# Patient Record
Sex: Female | Born: 1975 | Race: Asian | Hispanic: No | Smoking: Never smoker
Health system: Southern US, Community
[De-identification: ages and names within clinical notes are randomized; demographics above are authoritative.]

## PROBLEM LIST (undated history)

## (undated) DIAGNOSIS — J302 Other seasonal allergic rhinitis: Secondary | ICD-10-CM

## (undated) DIAGNOSIS — E2609 Other primary hyperaldosteronism: Secondary | ICD-10-CM

## (undated) DIAGNOSIS — R7303 Prediabetes: Secondary | ICD-10-CM

## (undated) DIAGNOSIS — D649 Anemia, unspecified: Secondary | ICD-10-CM

## (undated) DIAGNOSIS — N939 Abnormal uterine and vaginal bleeding, unspecified: Secondary | ICD-10-CM

## (undated) DIAGNOSIS — Z973 Presence of spectacles and contact lenses: Secondary | ICD-10-CM

## (undated) DIAGNOSIS — K649 Unspecified hemorrhoids: Secondary | ICD-10-CM

## (undated) DIAGNOSIS — I10 Essential (primary) hypertension: Secondary | ICD-10-CM

## (undated) HISTORY — PX: ADRENALECTOMY: SHX876

## (undated) HISTORY — PX: DILATION AND CURETTAGE OF UTERUS: SHX78

## (undated) HISTORY — PX: TUBAL LIGATION: SHX77

## (undated) HISTORY — PX: TRANSTHORACIC ECHOCARDIOGRAM: SHX275

## (undated) HISTORY — DX: Unspecified hemorrhoids: K64.9

---

## 2007-09-07 ENCOUNTER — Ambulatory Visit: Payer: Self-pay | Admitting: Orthopedic Surgery

## 2008-03-09 ENCOUNTER — Ambulatory Visit: Payer: Self-pay | Admitting: Obstetrics & Gynecology

## 2008-10-05 ENCOUNTER — Ambulatory Visit: Payer: Self-pay | Admitting: Obstetrics & Gynecology

## 2008-10-05 LAB — CONVERTED CEMR LAB
GC Probe Amp, Genital: NEGATIVE
Trich, Wet Prep: NONE SEEN

## 2008-11-07 ENCOUNTER — Encounter: Payer: Self-pay | Admitting: Family Medicine

## 2008-11-07 ENCOUNTER — Ambulatory Visit: Payer: Self-pay | Admitting: Family Medicine

## 2009-01-05 ENCOUNTER — Ambulatory Visit: Payer: Self-pay | Admitting: Family Medicine

## 2010-06-11 NOTE — Assessment & Plan Note (Signed)
Judith Wheeler, PEPPERMAN                   ACCOUNT NO.:  [id number]   MEDICAL RECORD NO.:  [id number]          PATIENT TYPE:  POB   LOCATION:  [facility] at [facility]         FACILITY:  [facility]   PHYSICIAN:  Tinnie Gens, MD        DATE OF BIRTH:  1975/03/19   DATE OF SERVICE:  11/07/2008                                  CLINIC NOTE   CHIEF COMPLAINT:  Followup Pap.   HISTORY OF PRESENT ILLNESS:  The patient is a 35 year old gravida 3,  para 2-1-2 who had a history of abnormal Pap, had at [facility]  apparently.  Her colposcopy was not adequate.  She had a followup  colposcopy with Dr. Nicholaus Bloom in February 2010 with a negative biopsy  and negative Pap, with a negative ECC and negative Pap earlier in  January.  The patient was supposed to have a followup Pap in 6 months  and the patient is back for this today.  The patient has no other  significant complaints.  She had a full physical by Dr. Nicholaus Bloom in  February.   PHYSICAL EXAMINATION:  VITAL SIGNS:  Her vitals are as noted in the  chart.  GENERAL:  She is a well-developed and well-nourished female in no acute  distress.  ABDOMEN:  Soft, nontender, nondistended.  GU:  Normal external female genitalia.  BUS is normal.  Vagina is pink  and rugated.  Cervix is nulliparous without lesion.  Uterus is small,  anteverted.  No adnexal mass or tenderness.   IMPRESSION:  History of abnormal Pap ASCUS with a negative ECC in  February 2010, for followup Pap today.   PLAN:  Check results in approximately 2 weeks.           ______________________________  Tinnie Gens, MD     TP/MEDQ  D:  11/07/2008  T:  11/08/2008  Job:  [id number]

## 2010-06-13 ENCOUNTER — Ambulatory Visit (INDEPENDENT_AMBULATORY_CARE_PROVIDER_SITE_OTHER): Payer: BC Managed Care – PPO | Admitting: Obstetrics and Gynecology

## 2010-06-13 DIAGNOSIS — Z1272 Encounter for screening for malignant neoplasm of vagina: Secondary | ICD-10-CM

## 2010-06-13 DIAGNOSIS — Z01419 Encounter for gynecological examination (general) (routine) without abnormal findings: Secondary | ICD-10-CM

## 2010-06-13 NOTE — Assessment & Plan Note (Signed)
Judith Wheeler, Judith Wheeler NO.:  [id number]  MEDICAL RECORD NO.:  [id number]           PATIENT TYPE:  LOCATION:  [facility] at [facility]           FACILITY:  PHYSICIAN:  Catalina Antigua, MD     DATE OF BIRTH:  July 22, 1975  DATE OF SERVICE:  06/13/2010                                 CLINIC NOTE  This is a 35 year old G3, P2-0-1-2 with LMP on May 20, 2010, who presents today for annual exam.  The patient is currently without any complaints.  Denies abnormal bleeding or discharge or pelvic pain.  The patient is currently using tubal ligation for birth control.  PAST MEDICAL HISTORY:  Significant for hypertension.  PAST SURGICAL HISTORY:  She has had a tubal ligation in 2004.  PAST OB HISTORY:  She has had two full-term C-section and one miscarriage.  PAST GYN HISTORY:  She denies any cyst or fibroids.  She does have a history of abnormal Pap smear in 2008, which was followed by colposcopy and repeat Pap smear.  FAMILY HISTORY:  Significant for hypertension in her parents.  SOCIAL HISTORY:  She denies drinking, smoking, or use of illicit drugs.  REVIEW OF SYSTEMS:  Otherwise within normal limits.  PHYSICAL EXAMINATION:  VITAL SIGNS:  Her blood pressure is 154/94, pulse of 66, weight of 163 pounds, height of 5 feet and 4 inches. LUNGS:  Clear to auscultation bilaterally. HEART:  Regular rate and rhythm. ABDOMEN:  Soft, nontender, nondistended. BREAST:  Equal in size, nontender, no palpable masses, or lymphadenopathy.  No expressible nipple discharge.  No skin dimpling. PELVIC:  Showed normal-appearing external genitalia, normal-appearing vaginal mucosa and cervix.  No abnormal bleeding or discharge.  She has small anteverted uterus.  No palpable adnexal masses or tenderness.  ASSESSMENT AND PLAN:  This is a 35 year old G3, P2-0-1-2 who is here for annual exam.  Pap smear was performed.  The patient is not interested in any STD testing.  The patient will be  contacted with any abnormal results.  The patient is to return in a year or p.r.n.  The patient was also advised to follow up with her primary care physician for the management of her hypertension.          ______________________________ Catalina Antigua, MD    PC/MEDQ  D:  06/13/2010  T:  06/13/2010  Job:  [id number]

## 2011-01-28 HISTORY — PX: OTHER SURGICAL HISTORY: SHX169

## 2011-02-27 ENCOUNTER — Emergency Department: Payer: Self-pay | Admitting: Emergency Medicine

## 2011-02-27 LAB — CBC WITH DIFFERENTIAL/PLATELET
Eosinophil #: 0.4 10*3/uL (ref 0.0–0.7)
Lymphocyte %: 34.8 %
MCH: 28.6 pg (ref 26.0–34.0)
Monocyte %: 7.3 %
Platelet: 325 10*3/uL (ref 150–440)
RDW: 13.8 % (ref 11.5–14.5)
WBC: 9.3 10*3/uL (ref 3.6–11.0)

## 2011-02-27 LAB — COMPREHENSIVE METABOLIC PANEL
Albumin: 4.6 g/dL (ref 3.4–5.0)
Anion Gap: 12 (ref 7–16)
BUN: 6 mg/dL — ABNORMAL LOW (ref 7–18)
Calcium, Total: 9.1 mg/dL (ref 8.5–10.1)
Chloride: 97 mmol/L — ABNORMAL LOW (ref 98–107)
Creatinine: 0.43 mg/dL — ABNORMAL LOW (ref 0.60–1.30)
EGFR (African American): >60
Osmolality: 284 (ref 275–301)
Potassium: 2.2 mmol/L — CL (ref 3.5–5.1)
SGOT(AST): 36 U/L (ref 15–37)
SGPT (ALT): 52 U/L
Sodium: 143 mmol/L (ref 136–145)

## 2011-02-28 LAB — POTASSIUM: Potassium: 2.9 mmol/L — ABNORMAL LOW (ref 3.5–5.1)

## 2011-02-28 LAB — MAGNESIUM: Magnesium: 2.2 mg/dL

## 2011-10-03 ENCOUNTER — Emergency Department: Payer: Self-pay | Admitting: Emergency Medicine

## 2011-10-03 LAB — BASIC METABOLIC PANEL
Calcium, Total: 8.9 mg/dL (ref 8.5–10.1)
Co2: 30 mmol/L (ref 21–32)
EGFR (African American): >60
Glucose: 112 mg/dL — ABNORMAL HIGH (ref 65–99)
Osmolality: 282 (ref 275–301)
Potassium: 2.7 mmol/L — ABNORMAL LOW (ref 3.5–5.1)
Sodium: 141 mmol/L (ref 136–145)

## 2011-10-03 LAB — CBC
MCV: 83 fL (ref 80–100)
Platelet: 312 10*3/uL (ref 150–440)
RBC: 4.44 10*6/uL (ref 3.80–5.20)
RDW: 14 % (ref 11.5–14.5)
WBC: 11 10*3/uL (ref 3.6–11.0)

## 2011-10-03 LAB — MAGNESIUM: Magnesium: 1.9 mg/dL

## 2011-11-24 ENCOUNTER — Ambulatory Visit: Payer: Self-pay | Admitting: Internal Medicine

## 2011-11-24 LAB — CREATININE, SERUM: EGFR (Non-African Amer.): >60

## 2012-01-12 ENCOUNTER — Telehealth: Payer: Self-pay | Admitting: *Deleted

## 2012-01-12 DIAGNOSIS — N390 Urinary tract infection, site not specified: Secondary | ICD-10-CM

## 2012-01-12 MED ORDER — CIPROFLOXACIN HCL 500 MG PO TABS: 500.0000 mg | ORAL_TABLET | Freq: Two times a day (BID) | ORAL | Status: DC

## 2012-01-12 NOTE — Telephone Encounter (Signed)
Patient is having pain and burning with urination.  She is unable to come to the office today due to working out of town.  We will call in meds for her and she will follow up if her symptoms return or persist for culture.

## 2012-08-30 ENCOUNTER — Telehealth: Payer: Self-pay | Admitting: *Deleted

## 2012-08-30 DIAGNOSIS — N390 Urinary tract infection, site not specified: Secondary | ICD-10-CM

## 2012-08-30 MED ORDER — CIPROFLOXACIN HCL 500 MG PO TABS: 500.0000 mg | ORAL_TABLET | Freq: Two times a day (BID) | ORAL | Status: DC

## 2012-08-30 NOTE — Telephone Encounter (Signed)
Patient is having symptoms of uti.  She gets this at least twice a year having urgency and discomfort with urination.. We will call in Cipro for her and she will come to office for culture if her symptoms persist or change.

## 2014-04-22 DIAGNOSIS — I10 Essential (primary) hypertension: Secondary | ICD-10-CM | POA: Insufficient documentation

## 2016-06-04 ENCOUNTER — Other Ambulatory Visit: Payer: Self-pay | Admitting: Family Medicine

## 2016-06-04 DIAGNOSIS — Z1231 Encounter for screening mammogram for malignant neoplasm of breast: Secondary | ICD-10-CM

## 2016-06-30 ENCOUNTER — Ambulatory Visit
Admission: RE | Admit: 2016-06-30 | Discharge: 2016-06-30 | Disposition: A | Discharge status: Home / Self Care | Payer: No Typology Code available for payment source | Source: Ambulatory Visit | Attending: Family Medicine | Admitting: Family Medicine

## 2016-06-30 ENCOUNTER — Encounter: Payer: Self-pay | Admitting: Radiology

## 2016-06-30 DIAGNOSIS — Z1231 Encounter for screening mammogram for malignant neoplasm of breast: Secondary | ICD-10-CM | POA: Diagnosis not present

## 2017-02-09 ENCOUNTER — Ambulatory Visit (INDEPENDENT_AMBULATORY_CARE_PROVIDER_SITE_OTHER): Payer: No Typology Code available for payment source | Admitting: Obstetrics & Gynecology

## 2017-02-09 VITALS — BP 163/115 | HR 72 | Wt 177.5 lb

## 2017-02-09 DIAGNOSIS — Z23 Encounter for immunization: Secondary | ICD-10-CM

## 2017-02-09 DIAGNOSIS — Z124 Encounter for screening for malignant neoplasm of cervix: Secondary | ICD-10-CM

## 2017-02-09 DIAGNOSIS — Z1151 Encounter for screening for human papillomavirus (HPV): Secondary | ICD-10-CM

## 2017-02-09 DIAGNOSIS — Z01419 Encounter for gynecological examination (general) (routine) without abnormal findings: Secondary | ICD-10-CM | POA: Diagnosis not present

## 2017-02-09 DIAGNOSIS — Z113 Encounter for screening for infections with a predominantly sexual mode of transmission: Secondary | ICD-10-CM | POA: Diagnosis not present

## 2017-02-09 DIAGNOSIS — K625 Hemorrhage of anus and rectum: Secondary | ICD-10-CM

## 2017-02-09 DIAGNOSIS — N920 Excessive and frequent menstruation with regular cycle: Secondary | ICD-10-CM

## 2017-02-09 MED ORDER — MEGESTROL ACETATE 40 MG PO TABS: 40.0000 mg | ORAL_TABLET | Freq: Two times a day (BID) | ORAL | 5 refills | Status: DC

## 2017-02-09 MED ORDER — MEGESTROL ACETATE 40 MG PO TABS: 40.0000 mg | ORAL_TABLET | Freq: Every day | ORAL | 5 refills | Status: DC

## 2017-02-09 NOTE — Progress Notes (Signed)
Last pap 2016

## 2017-02-09 NOTE — Progress Notes (Signed)
Subjective:    Judith Wheeler is a 42 y.o. divorced P2 (45 and 82 yo kids) female who presents for an annual exam.  She reports that her periods are getting heavier, especially on the first 2 days. She also has been having rectal bleeding for the last year. Bleeds through pads + tampons.  The patient is not currently sexually active. GYN screening history: last pap: was normal. The patient wears seatbelts: yes. The patient participates in regular exercise: no. Has the patient ever been transfused or tattooed?: no. The patient reports that there is not domestic violence in her life.   Menstrual History: OB History    No data available      Menarche age: 84 Patient's last menstrual period was 12/27/2016.    The following portions of the patient's history were reviewed and updated as appropriate: allergies, current medications, past family history, past medical history, past social history, past surgical history and problem list.  Review of Systems Pertinent items are noted in HPI.   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 Mammogram 6/18, normal   Objective:    BP (!) 163/115   Pulse 72   Wt 177 lb 8 oz (80.5 kg)   LMP 12/27/2016   General Appearance:    Alert, cooperative, no distress, appears stated age  Head:    Normocephalic, without obvious abnormality, atraumatic  Eyes:    PERRL, conjunctiva/corneas clear, EOM's intact, fundi    benign, both eyes  Ears:    Normal TM's and external ear canals, both ears  Nose:   Nares normal, septum midline, mucosa normal, no drainage    or sinus tenderness  Throat:   Lips, mucosa, and tongue normal; teeth and gums normal  Neck:   Supple, symmetrical, trachea midline, no adenopathy;    thyroid:  no enlargement/tenderness/nodules; no carotid   bruit or JVD  Back:     Symmetric, no curvature, ROM normal, no CVA tenderness  Lungs:     Clear to auscultation bilaterally, respirations unlabored  Chest Wall:    No  tenderness or deformity   Heart:    Regular rate and rhythm, S1 and S2 normal, no murmur, rub   or gallop  Breast Exam:    No tenderness, masses, or nipple abnormality  Abdomen:     Soft, non-tender, bowel sounds active all four quadrants,    no masses, no organomegaly  Genitalia:    Normal female without lesion, discharge or tenderness     Extremities:   Extremities normal, atraumatic, no cyanosis or edema  Pulses:   2+ and symmetric all extremities  Skin:   Skin color, texture, turgor normal, no rashes or lesions  Lymph nodes:   Cervical, supraclavicular, and axillary nodes normal  Neurologic:   CNII-XII intact, normal strength, sensation and reflexes    throughout  .    Assessment:    Healthy female exam.   Menorrhagia Rectal bleeding   Plan:     Thin prep Pap smear. with cotesting, STI testing Flu vaccine Refer to GI Gyn u/s, cbc, tsh She will be traveling in the [place] and will get her period while there. I have prescribed megace to be taken prn.

## 2017-02-10 LAB — HIV ANTIBODY (ROUTINE TESTING W REFLEX): HIV Screen 4th Generation wRfx: NONREACTIVE

## 2017-02-10 LAB — CBC
Hematocrit: 36.2 % (ref 34.0–46.6)
Hemoglobin: 11.7 g/dL (ref 11.1–15.9)
MCH: 27.6 pg (ref 26.6–33.0)
MCHC: 32.3 g/dL (ref 31.5–35.7)
MCV: 85 fL (ref 79–97)
Platelets: 271 x10E3/uL (ref 150–379)
RBC: 4.24 x10E6/uL (ref 3.77–5.28)
RDW: 13.7 % (ref 12.3–15.4)
WBC: 6.5 x10E3/uL (ref 3.4–10.8)

## 2017-02-10 LAB — TSH: TSH: 2.5 u[IU]/mL (ref 0.450–4.500)

## 2017-02-11 ENCOUNTER — Telehealth: Payer: Self-pay

## 2017-02-11 LAB — CYTOLOGY - PAP
ADEQUACY: ABSENT
CHLAMYDIA, DNA PROBE: NEGATIVE
DIAGNOSIS: NEGATIVE
HPV: NOT DETECTED
NEISSERIA GONORRHEA: NEGATIVE

## 2017-02-11 NOTE — Telephone Encounter (Signed)
-----   Message from Blanchie Dessert, [place] sent at 02/11/2017  9:18 AM EST ----- Regarding: question about medication Contact: [phone] Please call patient , she has a question about medication '

## 2017-02-11 NOTE — Telephone Encounter (Signed)
Called patient -left a message for her to call us back regarding medications.

## 2017-02-18 ENCOUNTER — Ambulatory Visit
Admission: RE | Admit: 2017-02-18 | Discharge: 2017-02-18 | Disposition: A | Discharge status: Home / Self Care | Payer: No Typology Code available for payment source | Source: Ambulatory Visit | Attending: Obstetrics & Gynecology | Admitting: Obstetrics & Gynecology

## 2017-02-18 DIAGNOSIS — D259 Leiomyoma of uterus, unspecified: Secondary | ICD-10-CM | POA: Insufficient documentation

## 2017-02-18 DIAGNOSIS — N920 Excessive and frequent menstruation with regular cycle: Secondary | ICD-10-CM | POA: Insufficient documentation

## 2017-02-19 ENCOUNTER — Other Ambulatory Visit: Payer: Self-pay

## 2017-02-19 ENCOUNTER — Ambulatory Visit (INDEPENDENT_AMBULATORY_CARE_PROVIDER_SITE_OTHER): Payer: No Typology Code available for payment source | Admitting: Gastroenterology

## 2017-02-19 ENCOUNTER — Encounter: Payer: Self-pay | Admitting: Gastroenterology

## 2017-02-19 VITALS — BP 131/90 | HR 80 | Ht 64.0 in | Wt 176.0 lb

## 2017-02-19 DIAGNOSIS — E2609 Other primary hyperaldosteronism: Secondary | ICD-10-CM | POA: Insufficient documentation

## 2017-02-19 DIAGNOSIS — K648 Other hemorrhoids: Secondary | ICD-10-CM | POA: Diagnosis not present

## 2017-02-19 DIAGNOSIS — K625 Hemorrhage of anus and rectum: Secondary | ICD-10-CM

## 2017-02-19 DIAGNOSIS — L811 Chloasma: Secondary | ICD-10-CM | POA: Insufficient documentation

## 2017-02-19 DIAGNOSIS — L719 Rosacea, unspecified: Secondary | ICD-10-CM | POA: Insufficient documentation

## 2017-02-19 DIAGNOSIS — M722 Plantar fascial fibromatosis: Secondary | ICD-10-CM | POA: Insufficient documentation

## 2017-02-19 DIAGNOSIS — J302 Other seasonal allergic rhinitis: Secondary | ICD-10-CM | POA: Insufficient documentation

## 2017-02-19 MED ORDER — HYDROCORTISONE ACETATE 25 MG RE SUPP: 25.0000 mg | Freq: Every day | RECTAL | 0 refills | Status: DC

## 2017-02-19 NOTE — Patient Instructions (Signed)
F/U in 3 months Contact office if symptoms do not improve.

## 2017-02-19 NOTE — Progress Notes (Signed)
Judith Wheeler [address]  [place]  [place], [place] [address]  Main: [phone]  Fax: [phone]   Gastroenterology Consultation  Referring Provider:     Emily Filbert, MD Primary Care Physician:  Sharyne Peach, MD Primary Gastroenterologist:  Dr. Vonda Wheeler Reason for Consultation:    Rectal bleeding        HPI:   Judith Wheeler is a 42 y.o. y/o female referred for consultation & management  by Dr. Iona Beard, Rubbie Battiest, MD.  Patient reports history of bright red blood per rectum, started 3-4 years ago, intermittent, has occurred 3-4 times over the last year.  Patient reports bright red blood per rectum has only occurred toward the end of her menstrual cycle each time.  It has not occurred without her menstrual cycle in the past.  She denies any significant pain at the start of her menstrual cycle, however, when the rectal bleeding starts, she reports a dull mid abdominal, 3/10 pain that lasts for 1-2 days with the rectal bleeding.  She is sure that the bleeding is different from her menstrual bleeding, as it is bright red.  She states she sometimes feels hemorrhoids when she wipes.  The right red blood is on the toilet paper and toilet bowl itself.  She reports 1-2 regular bowel movements daily, states they are not loose or hard.  Describes them as regular.  Denies straining with bowel movements.  Denies any urgency or tenesmus.  She states she recently had a vaginal exam vaginal exam she had a lot of lower abdominal pain.  She is not sexually active.  Patient denies any  nausea, weight loss, appetite loss, dysphagia, heartburn.  Denies any altered bowel habits.  No family history of colon cancer.  No previous history of endoscopies.  Past medical history: Hypertension, acne Past surgical history: Adrenal mass resection 2013, C-section  Prior to Admission medications   Medication Sig Start Date End Date Taking? Authorizing Provider  ciprofloxacin (CIPRO) 500 MG tablet  Take 1 tablet (500 mg total) by mouth 2 (two) times daily. Patient not taking: Reported on 02/09/2017 08/30/12   Emily Filbert, MD  megestrol (MEGACE) 40 MG tablet Take 1 tablet (40 mg total) by mouth 2 (two) times daily. 02/09/17   Emily Filbert, MD  megestrol (MEGACE) 40 MG tablet Take 1 tablet (40 mg total) by mouth daily. 02/09/17   Emily Filbert, MD    Family History  Problem Relation Age of Onset  . Breast cancer Neg Hx      Social History   Tobacco Use  . Smoking status: Not on file  Substance Use Topics  . Alcohol use: Not on file  . Drug use: Not on file    Allergies as of 02/19/2017  . (No Known Allergies)    Review of Systems:    All systems reviewed and negative except where noted in HPI.   Physical Exam:  Vitals reviewed  Vitals:   02/19/17 0934  BP: 131/90  Pulse: 80  Weight: 176 lb (79.8 kg)  Height: 5\' 4"  (1.626 m)    psych:  Alert and cooperative. Normal mood and affect. General:   Alert,  Well-developed, well-nourished, pleasant and cooperative in NAD Head:  Normocephalic and atraumatic. Eyes:  Sclera clear, no icterus.   Conjunctiva pink. Ears:  Normal auditory acuity. Nose:  No deformity, discharge, or lesions. Mouth:  No deformity or lesions,oropharynx pink & moist. Neck:  Supple; no masses or thyromegaly. Lungs:  Respirations even and unlabored.  Clear throughout to auscultation.   No wheezes, crackles, or rhonchi. No acute distress. Heart:  Regular rate and rhythm; no murmurs, clicks, rubs, or gallops. Abdomen:  Normal bowel sounds.  No bruits.  Soft, non-tender and non-distended without masses, hepatosplenomegaly or hernias noted.  No guarding or rebound tenderness.    Rectal: One small external hemorrhoid present.  Digital rectal exam showed brown stool, with no melena or bright blood.  Stool present in rectal vault, no masses present on digital rectal exam Msk:  Symmetrical without gross deformities. Good, equal movement & strength  bilaterally. Pulses:  Normal pulses noted. Extremities:  No clubbing or edema.  No cyanosis. Neurologic:  Alert and oriented x3;  grossly normal neurologically. Skin:  Intact without significant lesions or rashes. No jaundice. Lymph Nodes:  No significant cervical adenopathy. Psych:  Alert and cooperative. Normal mood and affect.   Labs: CBC    Component Value Date/Time   WBC 6.5 02/09/2017 1532   WBC 11.0 10/03/2011 1911   RBC 4.24 02/09/2017 1532   RBC 4.44 10/03/2011 1911   HGB 11.7 02/09/2017 1532   HCT 36.2 02/09/2017 1532   PLT 271 02/09/2017 1532   MCV 85 02/09/2017 1532   MCV 83 10/03/2011 1911   MCH 27.6 02/09/2017 1532   MCH 27.9 10/03/2011 1911   MCHC 32.3 02/09/2017 1532   MCHC 33.8 10/03/2011 1911   RDW 13.7 02/09/2017 1532   RDW 14.0 10/03/2011 1911   LYMPHSABS 3.2 02/27/2011 2022   MONOABS 0.7 02/27/2011 2022   EOSABS 0.4 02/27/2011 2022   BASOSABS 0.0 02/27/2011 2022   CMP     Component Value Date/Time   NA 141 10/03/2011 1911   K 2.7 (L) 10/03/2011 1911   CL 104 10/03/2011 1911   CO2 30 10/03/2011 1911   GLUCOSE 112 (H) 10/03/2011 1911   BUN 12 10/03/2011 1911   CREATININE 0.53 (L) 11/24/2011 0958   CALCIUM 8.9 10/03/2011 1911   PROT 8.5 (H) 02/27/2011 2022   ALBUMIN 4.6 02/27/2011 2022   AST 36 02/27/2011 2022   ALT 52 02/27/2011 2022   ALKPHOS 51 02/27/2011 2022   BILITOT 0.4 02/27/2011 2022   GFRNONAA >60 11/24/2011 0958   GFRAA >60 11/24/2011 0958    Imaging Studies: US Pelvis Transvanginal Non-ob (tv Only)  Result Date: 02/18/2017 CLINICAL DATA:  Menorrhagia with regular cycle, LMP 01/24/2017 EXAM: ULTRASOUND PELVIS TRANSVAGINAL TECHNIQUE: Transvaginal ultrasound examination of the pelvis was performed including evaluation of the uterus, ovaries, adnexal regions, and pelvic cul-de-sac. COMPARISON:  None FINDINGS: Uterus Measurements: 8.1 x 5.4 x 5.8 cm. Multiple nabothian cysts at cervix. Two uterine nodules likely representing  leiomyomata. 2.0 x 2.0 x 1.5 cm diameter submucosal lesion at the posterior upper to mid uterus. Small intramural leiomyoma LEFT lateral at upper uterus 1.6 x 1.4 x 1.3 cm. No additional masses Endometrium Thickness: 14 mm. Impinged upon by a posterior submucosal leiomyoma at the upper to mid uterus. No endometrial fluid. Right ovary Measurements: 3.5 x 1.7 x 2.3 cm. Normal morphology without mass. Internal blood flow present on color Doppler imaging. Left ovary Measurements: 2.4 x 1.5 x 1.6 cm. Normal morphology without mass. Internal blood flow present on color Doppler imaging. Other findings: Small amount of nonspecific free pelvic fluid. No adnexal masses. IMPRESSION: 2 uterine leiomyomata, largest 2.0 cm in diameter submucosal at upper to mid uterus posteriorly. Small amount of nonspecific free pelvic fluid. Otherwise negative exam. Electronically Signed   By: Elta Guadeloupe  Thornton Papas M.D.   On: 02/18/2017 17:00    Assessment and Plan:   Lovely Kerins is a 42 y.o. y/o female has been referred for bright red blood per rectum, that started 3-4 years ago, with rectal bleeding only occurring at the end of her menstrual cycle each time  Patient symptoms could be related to hemorrhoids, she has external hemorrhoids on her exam today We will start at bedtime and Anusol suppositories for 14 days. High-fiber diet Patient has no anemia on lab exam, despite the symptoms occurring intermittently for 3 or 4 years now.  This would go against colonic malignancy or IBD.  Since her symptoms occur at the end of her menstrual cycle each time, and patient describes significant pelvic pain after her recent vaginal exam.  I would question endometriosis.  I will send this note to her GYN to obtain their opinion in regard to this.  Patient states she has a follow-up appointment with her GYN in a few weeks and will discuss this with them.    Rectal or colonic endometriosis implants could also be causing her rectal bleeding.  If symptoms  do not improve, can consider colonoscopy. Patient asked to call us if symptoms do not improve  Follow-up in clinic in 3 months.  Dr Margretta Sidle Tahilianiinion in regard to this.

## 2017-03-19 ENCOUNTER — Ambulatory Visit (INDEPENDENT_AMBULATORY_CARE_PROVIDER_SITE_OTHER): Payer: No Typology Code available for payment source | Admitting: Family Medicine

## 2017-03-19 ENCOUNTER — Encounter: Payer: Self-pay | Admitting: Family Medicine

## 2017-03-19 VITALS — BP 161/101 | HR 72 | Wt 181.8 lb

## 2017-03-19 DIAGNOSIS — Z30430 Encounter for insertion of intrauterine contraceptive device: Secondary | ICD-10-CM

## 2017-03-19 DIAGNOSIS — N920 Excessive and frequent menstruation with regular cycle: Secondary | ICD-10-CM | POA: Diagnosis not present

## 2017-03-19 MED ORDER — LEVONORGESTREL 19.5 MCG/DAY IU IUD
INTRAUTERINE_SYSTEM | Freq: Once | INTRAUTERINE | Status: AC
Start: 2017-03-19 — End: 2017-03-19
  Administered 2017-03-19: 12:00:00 via INTRAUTERINE

## 2017-03-19 NOTE — Assessment & Plan Note (Signed)
Patient offered medicine, IUD, HTA (she is s/p BTL) or hyst--3 prior c-sections.  She wanted IUD first due to less risk factors--placed today. Try Ibuprofen. If not better in 24 hours, please call us back.

## 2017-03-19 NOTE — Patient Instructions (Signed)
Uterine Fibroids Uterine fibroids are tissue masses (tumors). They are also called leiomyomas. They can develop inside of a woman's womb (uterus). They can grow very large. Fibroids are not cancerous (benign). Most fibroids do not require medical treatment. Follow these instructions at home:  Keep all follow-up visits as told by your doctor. This is important.  Take medicines only as told by your doctor. ? If you were prescribed a hormone treatment, take the hormone medicines exactly as told. ? Do not take aspirin. It can cause bleeding.  Ask your doctor about taking iron pills and increasing the amount of dark green, leafy vegetables in your diet. These actions can help to boost your blood iron levels.  Pay close attention to your period. Tell your doctor about any changes, such as: ? Increased blood flow. This may require you to use more pads or tampons than usual per month. ? A change in the number of days that your period lasts per month. ? A change in symptoms that come with your period, such as back pain or cramping in your belly area (abdomen). Contact a doctor if:  You have pain in your back or the area between your hip bones (pelvic area) that is not controlled by medicines.  You have pain in your abdomen that is not controlled with medicines.  You have an increase in bleeding between and during periods.  You soak tampons or pads in a half hour or less.  You feel lightheaded.  You feel extra tired.  You feel weak. Get help right away if:  You pass out (faint).  You have a sudden increase in pelvic pain. This information is not intended to replace advice given to you by your health care provider. Make sure you discuss any questions you have with your health care provider. Document Released: 02/15/2010 Document Revised: 09/14/2015 Document Reviewed: 07/12/2013 Elsevier Interactive Patient Education  2018 [employer]. Levonorgestrel intrauterine device (IUD) What is  this medicine? LEVONORGESTREL IUD (LEE voe nor jes trel) is a contraceptive (birth control) device. The device is placed inside the uterus by a healthcare professional. It is used to prevent pregnancy. This device can also be used to treat heavy bleeding that occurs during your period. This medicine may be used for other purposes; ask your health care provider or pharmacist if you have questions. COMMON BRAND NAME(S): Minette Headland What should I tell my health care provider before I take this medicine? They need to know if you have any of these conditions: -abnormal Pap smear -cancer of the breast, uterus, or cervix -diabetes -endometritis -genital or pelvic infection now or in the past -have more than one sexual partner or your partner has more than one partner -heart disease -history of an ectopic or tubal pregnancy -immune system problems -IUD in place -liver disease or tumor -problems with blood clots or take blood-thinners -seizures -use intravenous drugs -uterus of unusual shape -vaginal bleeding that has not been explained -an unusual or allergic reaction to levonorgestrel, other hormones, silicone, or polyethylene, medicines, foods, dyes, or preservatives -pregnant or trying to get pregnant -breast-feeding How should I use this medicine? This device is placed inside the uterus by a health care professional. Talk to your pediatrician regarding the use of this medicine in children. Special care may be needed. Overdosage: If you think you have taken too much of this medicine contact a poison control center or emergency room at once. NOTE: This medicine is only for you. Do not share this  medicine with others. What if I miss a dose? This does not apply. Depending on the brand of device you have inserted, the device will need to be replaced every 3 to 5 years if you wish to continue using this type of birth control. What may interact with this medicine? Do not  take this medicine with any of the following medications: -amprenavir -bosentan -fosamprenavir This medicine may also interact with the following medications: -aprepitant -armodafinil -barbiturate medicines for inducing sleep or treating seizures -bexarotene -boceprevir -griseofulvin -medicines to treat seizures like carbamazepine, ethotoin, felbamate, oxcarbazepine, phenytoin, topiramate -modafinil -pioglitazone -rifabutin -rifampin -rifapentine -some medicines to treat HIV infection like atazanavir, efavirenz, indinavir, lopinavir, nelfinavir, tipranavir, ritonavir -St. John's wort -warfarin This list may not describe all possible interactions. Give your health care provider a list of all the medicines, herbs, non-prescription drugs, or dietary supplements you use. Also tell them if you smoke, drink alcohol, or use illegal drugs. Some items may interact with your medicine. What should I watch for while using this medicine? Visit your doctor or health care professional for regular check ups. See your doctor if you or your partner has sexual contact with others, becomes HIV positive, or gets a sexual transmitted disease. This product does not protect you against HIV infection (AIDS) or other sexually transmitted diseases. You can check the placement of the IUD yourself by reaching up to the top of your vagina with clean fingers to feel the threads. Do not pull on the threads. It is a good habit to check placement after each menstrual period. Call your doctor right away if you feel more of the IUD than just the threads or if you cannot feel the threads at all. The IUD may come out by itself. You may become pregnant if the device comes out. If you notice that the IUD has come out use a backup birth control method like condoms and call your health care provider. Using tampons will not change the position of the IUD and are okay to use during your period. This IUD can be safely scanned with  magnetic resonance imaging (MRI) only under specific conditions. Before you have an MRI, tell your healthcare provider that you have an IUD in place, and which type of IUD you have in place. What side effects may I notice from receiving this medicine? Side effects that you should report to your doctor or health care professional as soon as possible: -allergic reactions like skin rash, itching or hives, swelling of the face, lips, or tongue -fever, flu-like symptoms -genital sores -high blood pressure -no menstrual period for 6 weeks during use -pain, swelling, warmth in the leg -pelvic pain or tenderness -severe or sudden headache -signs of pregnancy -stomach cramping -sudden shortness of breath -trouble with balance, talking, or walking -unusual vaginal bleeding, discharge -yellowing of the eyes or skin Side effects that usually do not require medical attention (report to your doctor or health care professional if they continue or are bothersome): -acne -breast pain -change in sex drive or performance -changes in weight -cramping, dizziness, or faintness while the device is being inserted -headache -irregular menstrual bleeding within first 3 to 6 months of use -nausea This list may not describe all possible side effects. Call your doctor for medical advice about side effects. You may report side effects to FDA at 1-800-FDA-1088. Where should I keep my medicine? This does not apply. NOTE: This sheet is a summary. It may not cover all possible information. If you have questions  about this medicine, talk to your doctor, pharmacist, or health care provider.  2018 Elsevier/Gold Standard (2015-10-26 14:14:56)

## 2017-03-19 NOTE — Progress Notes (Signed)
   Subjective:    Patient ID: Judith Wheeler is a 42 y.o. female presenting with Follow-up (AUB )  on 03/19/2017  HPI: Reports some bleeding for the last 3 wks. Was not taking her Megace. nml TSH and CBC. U/s shows 2 cm submucosal fibroid.  Review of Systems  Constitutional: Negative for chills and fever.  Respiratory: Negative for shortness of breath.   Cardiovascular: Negative for chest pain.  Gastrointestinal: Negative for abdominal pain, nausea and vomiting.  Genitourinary: Negative for dysuria.  Skin: Negative for rash.      Objective:    BP (!) 161/101   Pulse 72   Wt 181 lb 12.8 oz (82.5 kg)   BMI 31.21 kg/m  Physical Exam  Constitutional: She is oriented to person, place, and time. She appears well-developed and well-nourished. No distress.  HENT:  Head: Normocephalic and atraumatic.  Eyes: No scleral icterus.  Neck: Neck supple.  Cardiovascular: Normal rate.  Pulmonary/Chest: Effort normal.  Abdominal: Soft.  Neurological: She is alert and oriented to person, place, and time.  Skin: Skin is warm and dry.  Psychiatric: She has a normal mood and affect.   Procedure: Patient identified, informed consent performed, signed copy in chart, time out was performed.  Urine pregnancy test negative.  Speculum placed in the vagina.  Cervix visualized.  Cleaned with Betadine x 2.  Grasped anteriourly with a single tooth tenaculum.  Uterus sounded to 9 cm.  Liletta IUD placed per manufacturer's recommendations.  Strings trimmed to 3 cm.   Patient given post procedure instructions and Liletta care card with expiration date.  Patient is asked to check IUD strings periodically and follow up in 4-6 weeks for IUD check.  Following the procedure, patient complained of intense pain on the left side. Taken to U/s where TVUS revealed IUD to be in the appropriate location. Significant bleeding noted.      Assessment & Plan:   Problem List Items Addressed This Visit      Unprioritized   Menorrhagia    Patient offered medicine, IUD, HTA (she is s/p BTL) or hyst--3 prior c-sections.  She wanted IUD first due to less risk factors--placed today. Try Ibuprofen. If not better in 24 hours, please call us back.       Other Visit Diagnoses    Encounter for IUD insertion    -  Primary   Relevant Medications   Levonorgestrel (LILETTA) 19.5 MCG/DAY IUD (Completed)      Total face-to-face time with patient: 15 minutes. Over 50% of encounter was spent on counseling and coordination of care. Return in about 2 months (around 05/17/2017) for a follow-up.  Judith Wheeler 03/19/2017 10:26 AM

## 2017-03-23 ENCOUNTER — Encounter (HOSPITAL_COMMUNITY): Payer: Self-pay

## 2017-03-23 ENCOUNTER — Inpatient Hospital Stay (HOSPITAL_COMMUNITY)
Admission: AD | Admit: 2017-03-23 | Discharge: 2017-03-24 | Disposition: A | Discharge status: Home / Self Care | Payer: No Typology Code available for payment source | Source: Ambulatory Visit | Attending: Emergency Medicine | Admitting: Emergency Medicine

## 2017-03-23 ENCOUNTER — Telehealth: Payer: Self-pay | Admitting: Radiology

## 2017-03-23 DIAGNOSIS — R1030 Lower abdominal pain, unspecified: Secondary | ICD-10-CM | POA: Insufficient documentation

## 2017-03-23 DIAGNOSIS — Z79899 Other long term (current) drug therapy: Secondary | ICD-10-CM | POA: Insufficient documentation

## 2017-03-23 DIAGNOSIS — N920 Excessive and frequent menstruation with regular cycle: Secondary | ICD-10-CM | POA: Insufficient documentation

## 2017-03-23 DIAGNOSIS — M722 Plantar fascial fibromatosis: Secondary | ICD-10-CM | POA: Insufficient documentation

## 2017-03-23 DIAGNOSIS — Z975 Presence of (intrauterine) contraceptive device: Secondary | ICD-10-CM | POA: Insufficient documentation

## 2017-03-23 DIAGNOSIS — T8384XA Pain from genitourinary prosthetic devices, implants and grafts, initial encounter: Secondary | ICD-10-CM

## 2017-03-23 DIAGNOSIS — M549 Dorsalgia, unspecified: Secondary | ICD-10-CM | POA: Insufficient documentation

## 2017-03-23 DIAGNOSIS — I160 Hypertensive urgency: Secondary | ICD-10-CM | POA: Diagnosis not present

## 2017-03-23 DIAGNOSIS — R109 Unspecified abdominal pain: Secondary | ICD-10-CM | POA: Diagnosis present

## 2017-03-23 DIAGNOSIS — I10 Essential (primary) hypertension: Secondary | ICD-10-CM | POA: Insufficient documentation

## 2017-03-23 DIAGNOSIS — I998 Other disorder of circulatory system: Secondary | ICD-10-CM

## 2017-03-23 HISTORY — DX: Essential (primary) hypertension: I10

## 2017-03-23 LAB — URINALYSIS, ROUTINE W REFLEX MICROSCOPIC
Bilirubin Urine: NEGATIVE
Glucose, UA: NEGATIVE mg/dL
KETONES UR: NEGATIVE mg/dL
Leukocytes, UA: NEGATIVE
Nitrite: NEGATIVE
PH: 8 (ref 5.0–8.0)
Protein, ur: NEGATIVE mg/dL
Specific Gravity, Urine: 1.011 (ref 1.005–1.030)

## 2017-03-23 LAB — I-STAT CHEM 8, ED
BUN: 16 mg/dL (ref 6–20)
CHLORIDE: 103 mmol/L (ref 101–111)
Calcium, Ion: 1.04 mmol/L — ABNORMAL LOW (ref 1.15–1.40)
Creatinine, Ser: 0.5 mg/dL (ref 0.44–1.00)
Glucose, Bld: 165 mg/dL — ABNORMAL HIGH (ref 65–99)
HEMATOCRIT: 32 % — AB (ref 36.0–46.0)
Hemoglobin: 10.9 g/dL — ABNORMAL LOW (ref 12.0–15.0)
POTASSIUM: 4.1 mmol/L (ref 3.5–5.1)
SODIUM: 139 mmol/L (ref 135–145)
TCO2: 26 mmol/L (ref 22–32)

## 2017-03-23 LAB — CBC WITH DIFFERENTIAL/PLATELET
BASOS ABS: 0 10*3/uL (ref 0.0–0.1)
BASOS PCT: 1 %
EOS ABS: 0.4 10*3/uL (ref 0.0–0.7)
EOS PCT: 6 %
HCT: 30.5 % — ABNORMAL LOW (ref 36.0–46.0)
HEMOGLOBIN: 10.2 g/dL — AB (ref 12.0–15.0)
LYMPHS ABS: 3.3 10*3/uL (ref 0.7–4.0)
Lymphocytes Relative: 50 %
MCH: 27.9 pg (ref 26.0–34.0)
MCHC: 33.4 g/dL (ref 30.0–36.0)
MCV: 83.3 fL (ref 78.0–100.0)
Monocytes Absolute: 0.4 10*3/uL (ref 0.1–1.0)
Monocytes Relative: 6 %
NEUTROS PCT: 37 %
Neutro Abs: 2.4 10*3/uL (ref 1.7–7.7)
PLATELETS: 282 10*3/uL (ref 150–400)
RBC: 3.66 MIL/uL — AB (ref 3.87–5.11)
RDW: 13.1 % (ref 11.5–15.5)
WBC: 6.5 10*3/uL (ref 4.0–10.5)

## 2017-03-23 LAB — POCT PREGNANCY, URINE: PREG TEST UR: NEGATIVE

## 2017-03-23 MED ORDER — HYDROMORPHONE HCL 1 MG/ML IJ SOLN
1.0000 mg | Freq: Once | INTRAMUSCULAR | Status: AC
  Administered 2017-03-23: 1 mg via INTRAVENOUS
  Filled 2017-03-23: qty 1

## 2017-03-23 MED ORDER — LACTATED RINGERS IV SOLN
INTRAVENOUS | Status: DC
  Administered 2017-03-23: 20:00:00 via INTRAVENOUS

## 2017-03-23 MED ORDER — ONDANSETRON HCL 4 MG/2ML IJ SOLN
4.0000 mg | Freq: Once | INTRAMUSCULAR | Status: AC
  Administered 2017-03-23: 4 mg via INTRAVENOUS
  Filled 2017-03-23: qty 2

## 2017-03-23 MED ORDER — TRAMADOL HCL 50 MG PO TABS: 50.0000 mg | ORAL_TABLET | Freq: Four times a day (QID) | ORAL | 0 refills | Status: DC | PRN

## 2017-03-23 NOTE — MAU Note (Signed)
Pt has HTN, was elevated in the office recently. Is on medication. Forgot to take it this morning, but took it about an hour ago.

## 2017-03-23 NOTE — ED Notes (Signed)
Pt arrives as tx from MAU at [facility] for eval of BP. [facility]d pain/lower back pain since having IUD placed 2/22. Pt was hypertensive and sent here for further eval... Pt is noncompliant with meds...  Pt currently pain free. Denies abd pain/back pain. Denies HA, blurred vision, dizziness, etc.

## 2017-03-23 NOTE — Telephone Encounter (Signed)
Patient called stating that she had an IUD inserted last Thursday 03/19/17 by Dr Kennon Rounds. Dr Kennon Rounds explained that if she had anu issues to contact th e office. Patient called stating that she is running a fever, increased BP and abdominal/back pain. Spoke with Dr Harolyn Rutherford, because we do not have an appointment for her to been senn her to day she instructed that the patient be seen at [facility] MAU. Instructed patient to go to [facility] for evaluation.

## 2017-03-23 NOTE — Discharge Instructions (Signed)
You are instructed to increase your 5 mg lisinopril to 1 whole tablet 10 mg daily.

## 2017-03-23 NOTE — ED Provider Notes (Signed)
I saw and evaluated the patient, reviewed the resident's note and I agree with the findings and plan.  Pertinent History: the pt has a history of recently having an IUD placed, she went to the [facility] hospital today for evaluation of the abdominal pain and was noted to have an elevated blood pressure with some dizziness.  She had her pain medicine which made her significantly better, her blood pressure also came down with this.  Pertinent Exam findings: On exam the patient has clear lungs, clear heart sounds, no tachycardia, soft nontender abdomen, mental status is normal, she is sleepy but arousable after the pain medication.  Blood pressure reevaluated, 136 ALT, recommended the patient double her dose from 5-10 mg of lisinopril.  She expressed understanding.  Stable for discharge   I personally interpreted the EKG as well as the resident and agree with the interpretation on the resident's chart.  Final diagnoses:  Poorly controlled blood pressure  Hypertensive urgency  Pain due to intrauterine contraceptive device (IUD), initial encounter [facility])      Noemi Chapel, MD 03/24/17 1355

## 2017-03-23 NOTE — MAU Note (Addendum)
Pt recently had an IUD place and has been having abdominal pain and back pain. Is taking tylenol which has helped. States she has had a fever but has not taken a temp at home. States she is having scant bleeding.

## 2017-03-23 NOTE — ED Provider Notes (Signed)
[facility] EMERGENCY DEPARTMENT Provider Note   CSN: [id number] Arrival date & time: 03/23/17  1706     History   Chief Complaint Chief Complaint  Patient presents with  . Abdominal Pain  . Back Pain    HPI Judith Wheeler is a 42 y.o. female.  HPI  42 year old female history of hypertension, recent IUD placed and was being evaluated at the [facility] clinic today and noted an incidental finding of elevated blood pressure/fever.  Systolic to 093O.  Patient states she has been out of the [place] and been taken her lisinopril but otherwise states having some vaginal discomfort secondary to the IUD placement previously.  Patient denies any chest pain, shortness of breath, syncope, headache changes in visual acuity.  Patient was given 1 mg of Dilaudid and Zofran prior to arrival.  Past Medical History:  Diagnosis Date  . Hemorrhoids   . Hypertension     Patient Active Problem List   Diagnosis Date Noted  . Menorrhagia 03/19/2017  . Seasonal allergies 02/19/2017  . 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 Plantar fasciitis 02/19/2017  . Melasma 02/19/2017  . Acne rosacea 02/19/2017  . Essential hypertension 04/22/2014    Past Surgical History:  Procedure Laterality Date  . ADRENALECTOMY     2015 (approx)  . CESAREAN SECTION     x2  . TUBAL LIGATION      OB History    Gravida Para Term Preterm AB Living   3       1 2    SAB TAB Ectopic Multiple Live Births   1               Home Medications    Prior to Admission medications   Medication Sig Start Date End Date Taking? Authorizing Provider  hydrocortisone (ANUSOL-HC) 25 MG suppository Place 1 suppository (25 mg total) rectally at bedtime. 02/19/17  Yes Vonda Antigua B, MD  lisinopril (PRINIVIL,ZESTRIL) 10 MG tablet Take 10 mg by mouth daily. 12/21/16  Yes [provider]  megestrol (MEGACE) 40 MG tablet  03/08/17   [provider]  traMADol (ULTRAM) 50 MG tablet Take 1 tablet  (50 mg total) by mouth every 6 (six) hours as needed. 03/23/17   Rasch, Artist Pais, NP    Family History Family History  Problem Relation Age of Onset  . Breast cancer Neg Hx     Social History Social History   Tobacco Use  . Smoking status: Never Smoker  . Smokeless tobacco: Never Used  Substance Use Topics  . Alcohol use: No    Frequency: Never  . Drug use: No     Allergies   Patient has no known allergies.   Review of Systems Review of Systems  Review of Systems  Constitutional: Negative for fever and chills.  HENT: Negative for ear pain, sore throat and trouble swallowing.   Eyes: Negative for pain and visual disturbance.  Respiratory: Negative for cough and shortness of breath.   Cardiovascular: Negative for chest pain and leg swelling.  Gastrointestinal: Negative for nausea, vomiting, abdominal pain and diarrhea.  Genitourinary: pelvic discomfort Musculoskeletal: Negative for back pain and joint swelling.  Skin: Negative for rash and wound.  Neurological: Negative for dizziness, syncope, speech difficulty, weakness and numbness.  Physical Exam Updated Vital Signs BP 131/80 (BP Location: Left Arm)   Pulse 63   Temp 98.4 F (36.9 C) (Oral)   Resp 18   Ht 5\' 4"  (1.626 m)   Wt 82.1 kg (  181 lb)   SpO2 98%   BMI 31.07 kg/m   Physical Exam  Physical Exam Vitals:   03/23/17 2028 03/23/17 2100  BP: (!) 181/115 131/80  Pulse: 74 63  Resp: 19 18  Temp: 98.2 F (36.8 C) 98.4 F (36.9 C)  SpO2: 100% 98%   Constitutional: Patient is in no acute distress Head: Normocephalic and atraumatic.  Eyes: Extraocular motion intact, no scleral icterus Neck: Supple without meningismus, mass, or overt JVD Respiratory: Effort normal and breath sounds normal. No respiratory distress. CV: Heart regular rate and rhythm, no obvious murmurs.  Pulses +2 and symmetric Abdomen: Soft, non-tender, non-distended MSK: Extremities are atraumatic without deformity, ROM  intact Skin: Warm, dry, intact Neuro: Alert and oriented, no motor deficit noted Psychiatric: Mood and affect are normal.  ED Treatments / Results  Labs (all labs ordered are listed, but only abnormal results are displayed) Labs Reviewed  URINALYSIS, ROUTINE W REFLEX MICROSCOPIC - Abnormal; Notable for the following components:      Result Value   Hgb urine dipstick SMALL (*)    Bacteria, UA RARE (*)    Squamous Epithelial / LPF 0-5 (*)    All other components within normal limits  CBC WITH DIFFERENTIAL/PLATELET - Abnormal; Notable for the following components:   RBC 3.66 (*)    Hemoglobin 10.2 (*)    HCT 30.5 (*)    All other components within normal limits  POCT PREGNANCY, URINE    EKG  EKG Interpretation None       Radiology No results found.  Procedures Procedures (including critical care time)  Medications Ordered in ED Medications  lactated ringers infusion ( Intravenous New Bag/Given 03/23/17 2024)  ondansetron (ZOFRAN) injection 4 mg (4 mg Intravenous Given 03/23/17 2013)  HYDROmorphone (DILAUDID) injection 1 mg (1 mg Intravenous Given 03/23/17 2025)     Initial Impression / Assessment and Plan / ED Course  I have reviewed the triage vital signs and the nursing notes.  Pertinent labs & imaging results that were available during my care of the patient were reviewed by me and considered in my medical decision making (see chart for details).     42 year old female history of hypertension, recent IUD placed and was being evaluated at the [facility] clinic today and noted an incidental finding of elevated blood pressure/fever.  Systolic to 604V.  Patient states she has been out of the country and been taken her lisinopril but otherwise states having some vaginal discomfort secondary to the IUD placement previously.  Patient denies any chest pain, shortness of breath, syncope, headache changes in visual acuity.  Patient was given 1 mg of Dilaudid and Zofran prior to  arrival.  Patient has no evidence of leukocytosis, stable H&H, negative electrolyte imbalance concerning for endorgan damage.  Patient systolic is 409W without antihypertensives given in the emergency department.  Suspect blood pressure elevation is likely related to uncontrolled pain.  Will instruct patient to increase her dosing from 5 mg to 10 mg lisinopril daily and follow-up with primary care provider for appropriate chronic BP management.  Patient understands plan will be discharged home with good return precautions.  Final Clinical Impressions(s) / ED Diagnoses   Final diagnoses:  Poorly controlled blood pressure  Hypertensive urgency  Pain due to intrauterine contraceptive device (IUD), initial encounter [facility])    ED Discharge Orders        Ordered    traMADol (ULTRAM) 50 MG tablet  Every 6 hours PRN     03/23/17  [place], [place], DO 03/23/17 2316    Noemi Chapel, MD 03/24/17 1355

## 2017-03-23 NOTE — MAU Provider Note (Signed)
History     CSN: [id number]  Arrival date and time: 03/23/17 1706   First Provider Initiated Contact with Patient 03/23/17 1825      Chief Complaint  Patient presents with  . Abdominal Pain  . Back Pain   HPI   Ms.Judith Wheeler is a 42 y.o. female [phone] here in MAU with complaints of abdominal pain and back pain. States she had an IUD placed on 2/22. Says this is the first time she has ever had an IUD. Says the MD who put it in did an [place] to confirm placement.  Says her back pain started hurting immediatly after placement. Says that is why they did an [place]. Has high blood and has not taken her medication today, other than on her way here. Says she may have forgotten her BP medication several times. Has had HA and dizziness off and on for the past week. Thought maybe it was due to recent travel.  Denies chest pain.   OB History    Gravida Para Term Preterm AB Living   3       1 2    SAB TAB Ectopic Multiple Live Births   1              Past Medical History:  Diagnosis Date  . Hemorrhoids   . Hypertension     Past Surgical History:  Procedure Laterality Date  . ADRENALECTOMY     2015 (approx)  . CESAREAN SECTION     x2  . TUBAL LIGATION      Family History  Problem Relation Age of Onset  . Breast cancer Neg Hx     Social History   Tobacco Use  . Smoking status: Never Smoker  . Smokeless tobacco: Never Used  Substance Use Topics  . Alcohol use: No    Frequency: Never  . Drug use: No    Allergies: No Known Allergies  Medications Prior to Admission  Medication Sig Dispense Refill Last Dose  . hydrocortisone (ANUSOL-HC) 25 MG suppository Place 1 suppository (25 mg total) rectally at bedtime. 14 suppository 0 Taking  . lisinopril (PRINIVIL,ZESTRIL) 10 MG tablet Take 10 mg by mouth daily.  11 Taking  . megestrol (MEGACE) 40 MG tablet    Taking   Results for orders placed or performed during the hospital encounter of 03/23/17 (from the past 48 hour(s))  Urinalysis,  Routine w reflex microscopic     Status: Abnormal   Collection Time: 03/23/17  5:36 PM  Result Value Ref Range   Color, Urine YELLOW YELLOW   APPearance CLEAR CLEAR   Specific Gravity, Urine 1.011 1.005 - 1.030   pH 8.0 5.0 - 8.0   Glucose, UA NEGATIVE NEGATIVE mg/dL   Hgb urine dipstick SMALL (A) NEGATIVE   Bilirubin Urine NEGATIVE NEGATIVE   Ketones, ur NEGATIVE NEGATIVE mg/dL   Protein, ur NEGATIVE NEGATIVE mg/dL   Nitrite NEGATIVE NEGATIVE   Leukocytes, UA NEGATIVE NEGATIVE   RBC / HPF 0-5 0 - 5 RBC/hpf   WBC, UA 0-5 0 - 5 WBC/hpf   Bacteria, UA RARE (A) NONE SEEN   Squamous Epithelial / LPF 0-5 (A) NONE SEEN   Mucus PRESENT     Comment: Performed at [facility], [address], [place], [place] [address]  Pregnancy, urine POC     Status: None   Collection Time: 03/23/17  5:52 PM  Result Value Ref Range   Preg Test, Ur NEGATIVE NEGATIVE    Comment:  THE SENSITIVITY OF THIS METHODOLOGY IS >24 mIU/mL   CBC with Differential     Status: Abnormal   Collection Time: 03/23/17  6:06 PM  Result Value Ref Range   WBC 6.5 4.0 - 10.5 K/uL   RBC 3.66 (L) 3.87 - 5.11 MIL/uL   Hemoglobin 10.2 (L) 12.0 - 15.0 g/dL   HCT 30.5 (L) 36.0 - 46.0 %   MCV 83.3 78.0 - 100.0 fL   MCH 27.9 26.0 - 34.0 pg   MCHC 33.4 30.0 - 36.0 g/dL   RDW 13.1 11.5 - 15.5 %   Platelets 282 150 - 400 K/uL   Neutrophils Relative % 37 %   Neutro Abs 2.4 1.7 - 7.7 K/uL   Lymphocytes Relative 50 %   Lymphs Abs 3.3 0.7 - 4.0 K/uL   Monocytes Relative 6 %   Monocytes Absolute 0.4 0.1 - 1.0 K/uL   Eosinophils Relative 6 %   Eosinophils Absolute 0.4 0.0 - 0.7 K/uL   Basophils Relative 1 %   Basophils Absolute 0.0 0.0 - 0.1 K/uL    Comment: Performed at [facility], [address], [place], [place] [address]    Review of Systems  Constitutional: Positive for fatigue.  Respiratory: Negative for chest tightness.   Cardiovascular: Negative for chest pain.  Neurological: Positive for  light-headedness and headaches.   Physical Exam   Blood pressure (!) 171/110, pulse 80, temperature 98.5 F (36.9 C), temperature source Oral, resp. rate 16, weight 181 lb (82.1 kg), SpO2 99 %.   Patient Vitals for the past 24 hrs:  BP Temp Temp src Pulse Resp SpO2 Weight  03/23/17 1913 (!) 172/111 - - 78 - - -  03/23/17 1804 (!) 171/110 - - 80 - - -  03/23/17 1749 (!) 173/107 - - 81 - 99 % -  03/23/17 1748 - 98.5 F (36.9 C) Oral 79 16 100 % -  03/23/17 1739 - - - - - - 181 lb (82.1 kg)    Physical Exam  Constitutional: She is oriented to person, place, and time. She appears well-developed and well-nourished. No distress.  HENT:  Head: Normocephalic.  Eyes: Pupils are equal, round, and reactive to light.  Genitourinary:  Genitourinary Comments: Bimanual exam: Cervix closed, IUD strings palpated, no CMT  Uterus non tender, mildly enlarged  Adnexa non tender, no masses bilaterally Chaperone present for exam.   Neurological: She is alert and oriented to person, place, and time. No cranial nerve deficit or sensory deficit. GCS eye subscore is 4. GCS verbal subscore is 5. GCS motor subscore is 6.  Skin: Skin is warm. She is not diaphoretic.  Psychiatric: Her behavior is normal.   MAU Course  Procedures  None  MDM  Patient requesting IUD out tonight. Recommend patient try pain medication at home for a few days and then call the office if the pain persists. Discussed normalcy of pain following IUD placement. Patient unable to use ibuprofen due to BP Severely elevated BP readings in MAU. Patient with HA and dizziness for a week. Has been inconsistent with BP medication. Discussed with Dr. Sabra Heck with [facility] ED who recommends transfer for evaluation of BP.  Dilaudid 1 mg IV with Zofran 4 mg IV   Assessment and Plan   A:  1. Hypertensive urgency   2. Poorly controlled blood pressure   3. Pain due to intrauterine contraceptive device (IUD), initial encounter ([place])      P:  Transfer to [facility] for further treatment  of BP Rx: Ultram Call the office tomorrow to schedule appointment Return to MAU for emergencies  [name], Artist Pais, NP 03/23/2017 8:19 PM

## 2017-03-24 ENCOUNTER — Encounter: Payer: Self-pay | Admitting: Family Medicine

## 2017-03-24 ENCOUNTER — Ambulatory Visit (INDEPENDENT_AMBULATORY_CARE_PROVIDER_SITE_OTHER): Payer: No Typology Code available for payment source | Admitting: Family Medicine

## 2017-03-24 VITALS — BP 148/98 | HR 98 | Wt 177.2 lb

## 2017-03-24 DIAGNOSIS — Z30432 Encounter for removal of intrauterine contraceptive device: Secondary | ICD-10-CM

## 2017-03-24 DIAGNOSIS — N920 Excessive and frequent menstruation with regular cycle: Secondary | ICD-10-CM | POA: Diagnosis not present

## 2017-03-24 MED ORDER — MEGESTROL ACETATE 40 MG PO TABS: 40.0000 mg | ORAL_TABLET | Freq: Two times a day (BID) | ORAL | 2 refills | Status: DC

## 2017-03-24 NOTE — Patient Instructions (Signed)
Endometrial Ablation Endometrial ablation is a procedure that destroys the thin inner layer of the lining of the uterus (endometrium). This procedure may be done:  To stop heavy periods.  To stop bleeding that is causing anemia.  To control irregular bleeding.  To treat bleeding caused by small tumors (fibroids) in the endometrium.  This procedure is often an alternative to major surgery, such as removal of the uterus and cervix (hysterectomy). As a result of this procedure:  You may not be able to have children. However, if you are premenopausal (you have not gone through menopause): ? You may still have a small chance of getting pregnant. ? You will need to use a reliable method of birth control after the procedure to prevent pregnancy.  You may stop having a menstrual period, or you may have only a small amount of bleeding during your period. Menstruation may return several years after the procedure.  Tell a health care provider about:  Any allergies you have.  All medicines you are taking, including vitamins, herbs, eye drops, creams, and over-the-counter medicines.  Any problems you or family members have had with the use of anesthetic medicines.  Any blood disorders you have.  Any surgeries you have had.  Any medical conditions you have. What are the risks? Generally, this is a safe procedure. However, problems may occur, including:  A hole (perforation) in the uterus or bowel.  Infection of the uterus, bladder, or vagina.  Bleeding.  Damage to other structures or organs.  An air bubble in the lung (air embolus).  Problems with pregnancy after the procedure.  Failure of the procedure.  Decreased ability to diagnose cancer in the endometrium.  What happens before the procedure?  You will have tests of your endometrium to make sure there are no pre-cancerous cells or cancer cells present.  You may have an ultrasound of the uterus.  You may be given  medicines to thin the endometrium.  Ask your health care provider about: ? Changing or stopping your regular medicines. This is especially important if you take diabetes medicines or blood thinners. ? Taking medicines such as aspirin and ibuprofen. These medicines can thin your blood. Do not take these medicines before your procedure if your doctor tells you not to.  Plan to have someone take you home from the hospital or clinic. What happens during the procedure?  You will lie on an exam table with your feet and legs supported as in a pelvic exam.  To lower your risk of infection: ? Your health care team will wash or sanitize their hands and put on germ-free (sterile) gloves. ? Your genital area will be washed with soap.  An IV tube will be inserted into one of your veins.  You will be given a medicine to help you relax (sedative).  A surgical instrument with a light and camera (resectoscope) will be inserted into your vagina and moved into your uterus. This allows your surgeon to see inside your uterus.  Endometrial tissue will be removed using one of the following methods: ? Radiofrequency. This method uses a radiofrequency-alternating electric current to remove the endometrium. ? Cryotherapy. This method uses extreme cold to freeze the endometrium. ? Heated-free liquid. This method uses a heated saltwater (saline) solution to remove the endometrium. ? Microwave. This method uses high-energy microwaves to heat up the endometrium and remove it. ? Thermal balloon. This method involves inserting a catheter with a balloon tip into the uterus. The balloon tip is   filled with heated fluid to remove the endometrium. The procedure may vary among health care providers and hospitals. What happens after the procedure?  Your blood pressure, heart rate, breathing rate, and blood oxygen level will be monitored until the medicines you were given have worn off.  As tissue healing occurs, you may  notice vaginal bleeding for 4-6 weeks after the procedure. You may also experience: ? Cramps. ? Thin, watery vaginal discharge that is light pink or brown in color. ? A need to urinate more frequently than usual. ? Nausea.  Do not drive for 24 hours if you were given a sedative.  Do not have sex or insert anything into your vagina until your health care provider approves. Summary  Endometrial ablation is done to treat the many causes of heavy menstrual bleeding.  The procedure may be done only after medications have been tried to control the bleeding.  Plan to have someone take you home from the hospital or clinic. This information is not intended to replace advice given to you by your health care provider. Make sure you discuss any questions you have with your health care provider. Document Released: 11/23/2003 Document Revised: 01/31/2016 Document Reviewed: 01/31/2016 Elsevier Interactive Patient Education  2017 [employer]  

## 2017-03-24 NOTE — Assessment & Plan Note (Signed)
Take Megace bid with cycles and 2-4 wks prior to HTA. Discussed risks/benefits of surgery. Risks include but are not limited to bleeding, infection, injury to surrounding structures, including bowel, bladder and ureters, blood clots, and death.  Likelihood of success is high.

## 2017-03-24 NOTE — Progress Notes (Signed)
   Subjective:    Patient ID: Judith Wheeler is a 42 y.o. female presenting with IUD REMOVAL  on 03/24/2017  HPI: Here today in f/u. She had IUD placed last week due to heavy vaginal bleeding after consideration of options. She has continued to have low back pain and lower abdominal pain. She strongly desires IUD removal and something else to help her cycles.  Review of Systems  Constitutional: Negative for chills and fever.  Respiratory: Negative for shortness of breath.   Cardiovascular: Negative for chest pain.  Gastrointestinal: Positive for abdominal pain. Negative for nausea and vomiting.  Genitourinary: Negative for dysuria.  Musculoskeletal: Positive for back pain.  Skin: Negative for rash.      Objective:    BP (!) 148/98   Pulse 98   Wt 177 lb 3.2 oz (80.4 kg)   BMI 30.42 kg/m  Physical Exam  Constitutional: She is oriented to person, place, and time. She appears well-developed and well-nourished. No distress.  HENT:  Head: Normocephalic and atraumatic.  Eyes: No scleral icterus.  Neck: Neck supple.  Cardiovascular: Normal rate.  Pulmonary/Chest: Effort normal.  Abdominal: Soft.  Genitourinary: Vagina normal.  Genitourinary Comments: IUD strings noted  Neurological: She is alert and oriented to person, place, and time.  Skin: Skin is warm and dry.  Psychiatric: She has a normal mood and affect.   Procedure: Speculum placed inside vagina.  Cervix visualized.  Strings grasped with ring forceps.  IUD removed intact.      Assessment & Plan:   Problem List Items Addressed This Visit      Unprioritized   Menorrhagia - Primary    Take Megace bid with cycles and 2-4 wks prior to HTA. Discussed risks/benefits of surgery. Risks include but are not limited to bleeding, infection, injury to surrounding structures, including bowel, bladder and ureters, blood clots, and death.  Likelihood of success is high.       Relevant Medications   megestrol (MEGACE) 40 MG tablet      Other Visit Diagnoses    Encounter for IUD removal         Return in about 3 months (around 06/21/2017) for postop check.   Total face-to-face time with patient: 10 minutes. Over 50% of encounter was spent on counseling and coordination of care.  [name] 03/24/2017 10:03 AM

## 2017-03-25 ENCOUNTER — Encounter (HOSPITAL_COMMUNITY): Payer: Self-pay

## 2017-04-27 ENCOUNTER — Encounter (HOSPITAL_COMMUNITY): Payer: Self-pay

## 2017-05-04 NOTE — H&P (Signed)
  Judith Wheeler is an 42 y.o. [phone] female.   Chief Complaint: Abnormal bleeding HPI: She reports that her periods are getting heavier, especially on the first 2 days.  She has continued to have low back pain and lower abdominal pain. She has tried and failed IUD.  She has a h/o c-section x 2.     Past Medical History:  Diagnosis Date  . Hemorrhoids   . Hypertension     Past Surgical History:  Procedure Laterality Date  . ADRENALECTOMY     2015 (approx)  . CESAREAN SECTION     x2  . TUBAL LIGATION      Family History  Problem Relation Age of Onset  . Breast cancer Neg Hx    Social History:  reports that she has never smoked. She has never used smokeless tobacco. She reports that she does not drink alcohol or use drugs.  Allergies: No Known Allergies  No medications prior to admission.    A comprehensive review of systems was negative.  There were no vitals taken for this visit. General appearance: alert, cooperative and appears stated age Head: Normocephalic, without obvious abnormality, atraumatic Neck: supple, symmetrical, trachea midline Lungs: normal effort Heart: regular rate and rhythm Abdomen: soft, non-tender; bowel sounds normal; no masses,  no organomegaly Extremities: extremities normal, atraumatic, no cyanosis or edema Skin: Skin color, texture, turgor normal. No rashes or lesions Neurologic: Grossly normal   Lab Results  Component Value Date   WBC 6.5 03/23/2017   HGB 10.9 (L) 03/23/2017   HCT 32.0 (L) 03/23/2017   MCV 83.3 03/23/2017   PLT 282 03/23/2017   Lab Results  Component Value Date   PREGTESTUR NEGATIVE 03/23/2017     Assessment/Plan Principal Problem:   Menorrhagia  For endeometrial ablation with Minerva Risks include but are not limited to bleeding, infection, injury to surrounding structures, including bowel, bladder and ureters, blood clots, and death.  Likelihood of success is high.    Judith Wheeler 05/04/2017, 5:07  PM

## 2017-05-07 ENCOUNTER — Encounter (HOSPITAL_BASED_OUTPATIENT_CLINIC_OR_DEPARTMENT_OTHER): Payer: Self-pay | Admitting: *Deleted

## 2017-05-11 ENCOUNTER — Encounter (HOSPITAL_BASED_OUTPATIENT_CLINIC_OR_DEPARTMENT_OTHER): Payer: Self-pay | Admitting: *Deleted

## 2017-05-11 ENCOUNTER — Other Ambulatory Visit: Payer: Self-pay

## 2017-05-11 NOTE — Progress Notes (Signed)
SPOKE W/ PT VIA PHONE FOR PRE-OP INTERVIEW.  NPO AFTER MN W/ EXCEPTION CLEAR LIQUIDS UNTIL 0700 (NO CREAM/ MILK PRODUCTS).  ARRIVE AT 1100.  NEEDS URINE PREG.  GETTING CBC,BMET, EKG DONE Tuesday 05-19-2017 @ 1100.

## 2017-05-18 ENCOUNTER — Ambulatory Visit: Payer: No Typology Code available for payment source | Admitting: Gastroenterology

## 2017-05-19 ENCOUNTER — Ambulatory Visit: Payer: No Typology Code available for payment source | Admitting: Family Medicine

## 2017-05-19 ENCOUNTER — Encounter (HOSPITAL_COMMUNITY)
Admission: RE | Admit: 2017-05-19 | Discharge: 2017-05-19 | Disposition: A | Discharge status: Home / Self Care | Payer: No Typology Code available for payment source | Source: Ambulatory Visit | Attending: Family Medicine | Admitting: Family Medicine

## 2017-05-19 ENCOUNTER — Other Ambulatory Visit: Payer: Self-pay | Admitting: Family Medicine

## 2017-05-19 DIAGNOSIS — Z79899 Other long term (current) drug therapy: Secondary | ICD-10-CM | POA: Diagnosis not present

## 2017-05-19 DIAGNOSIS — N920 Excessive and frequent menstruation with regular cycle: Secondary | ICD-10-CM

## 2017-05-19 DIAGNOSIS — I10 Essential (primary) hypertension: Secondary | ICD-10-CM | POA: Diagnosis not present

## 2017-05-19 DIAGNOSIS — N938 Other specified abnormal uterine and vaginal bleeding: Secondary | ICD-10-CM | POA: Diagnosis not present

## 2017-05-19 DIAGNOSIS — N840 Polyp of corpus uteri: Secondary | ICD-10-CM | POA: Diagnosis not present

## 2017-05-19 DIAGNOSIS — N939 Abnormal uterine and vaginal bleeding, unspecified: Secondary | ICD-10-CM | POA: Diagnosis present

## 2017-05-19 LAB — BASIC METABOLIC PANEL
Anion gap: 8 (ref 5–15)
BUN: 12 mg/dL (ref 6–20)
CALCIUM: 8.8 mg/dL — AB (ref 8.9–10.3)
CO2: 24 mmol/L (ref 22–32)
CREATININE: 0.66 mg/dL (ref 0.44–1.00)
Chloride: 109 mmol/L (ref 101–111)
GFR calc non Af Amer: >60 mL/min (ref >60)
Glucose, Bld: 138 mg/dL — ABNORMAL HIGH (ref 65–99)
Potassium: 3.4 mmol/L — ABNORMAL LOW (ref 3.5–5.1)
SODIUM: 141 mmol/L (ref 135–145)

## 2017-05-19 LAB — CBC
HCT: 34.3 % — ABNORMAL LOW (ref 36.0–46.0)
Hemoglobin: 10.7 g/dL — ABNORMAL LOW (ref 12.0–15.0)
MCH: 24.8 pg — ABNORMAL LOW (ref 26.0–34.0)
MCHC: 31.2 g/dL (ref 30.0–36.0)
MCV: 79.4 fL (ref 78.0–100.0)
PLATELETS: 300 10*3/uL (ref 150–400)
RBC: 4.32 MIL/uL (ref 3.87–5.11)
RDW: 14.6 % (ref 11.5–15.5)
WBC: 6.4 10*3/uL (ref 4.0–10.5)

## 2017-05-20 ENCOUNTER — Ambulatory Visit (HOSPITAL_BASED_OUTPATIENT_CLINIC_OR_DEPARTMENT_OTHER)
Admission: RE | Admit: 2017-05-20 | Discharge: 2017-05-20 | Disposition: A | Discharge status: Home / Self Care | Payer: No Typology Code available for payment source | Source: Ambulatory Visit | Attending: Family Medicine | Admitting: Family Medicine

## 2017-05-20 ENCOUNTER — Encounter (HOSPITAL_BASED_OUTPATIENT_CLINIC_OR_DEPARTMENT_OTHER)
Admission: RE | Disposition: A | Discharge status: Home / Self Care | Payer: Self-pay | Source: Ambulatory Visit | Attending: Family Medicine

## 2017-05-20 ENCOUNTER — Ambulatory Visit (HOSPITAL_BASED_OUTPATIENT_CLINIC_OR_DEPARTMENT_OTHER): Payer: No Typology Code available for payment source | Admitting: Anesthesiology

## 2017-05-20 ENCOUNTER — Encounter (HOSPITAL_BASED_OUTPATIENT_CLINIC_OR_DEPARTMENT_OTHER): Payer: Self-pay | Admitting: *Deleted

## 2017-05-20 DIAGNOSIS — D259 Leiomyoma of uterus, unspecified: Secondary | ICD-10-CM

## 2017-05-20 DIAGNOSIS — N920 Excessive and frequent menstruation with regular cycle: Secondary | ICD-10-CM | POA: Diagnosis not present

## 2017-05-20 DIAGNOSIS — N840 Polyp of corpus uteri: Secondary | ICD-10-CM | POA: Insufficient documentation

## 2017-05-20 DIAGNOSIS — I10 Essential (primary) hypertension: Secondary | ICD-10-CM | POA: Insufficient documentation

## 2017-05-20 DIAGNOSIS — N938 Other specified abnormal uterine and vaginal bleeding: Secondary | ICD-10-CM | POA: Insufficient documentation

## 2017-05-20 DIAGNOSIS — Z79899 Other long term (current) drug therapy: Secondary | ICD-10-CM | POA: Insufficient documentation

## 2017-05-20 HISTORY — PX: HYSTEROSCOPY WITH D & C: SHX1775

## 2017-05-20 HISTORY — DX: Abnormal uterine and vaginal bleeding, unspecified: N93.9

## 2017-05-20 HISTORY — DX: Presence of spectacles and contact lenses: Z97.3

## 2017-05-20 HISTORY — PX: ENDOMETRIAL ABLATION: SHX621

## 2017-05-20 HISTORY — DX: Other primary hyperaldosteronism: E26.09

## 2017-05-20 HISTORY — DX: Other seasonal allergic rhinitis: J30.2

## 2017-05-20 LAB — POCT PREGNANCY, URINE: PREG TEST UR: NEGATIVE

## 2017-05-20 SURGERY — DILATATION AND CURETTAGE /HYSTEROSCOPY
Anesthesia: General | Site: Uterus

## 2017-05-20 MED ORDER — MIDAZOLAM HCL 5 MG/5ML IJ SOLN
INTRAMUSCULAR | Status: DC | PRN
  Administered 2017-05-20: 2 mg via INTRAVENOUS

## 2017-05-20 MED ORDER — MIDAZOLAM HCL 2 MG/2ML IJ SOLN
INTRAMUSCULAR | Status: AC
  Filled 2017-05-20: qty 2

## 2017-05-20 MED ORDER — LIDOCAINE 2% (20 MG/ML) 5 ML SYRINGE
INTRAMUSCULAR | Status: AC
  Filled 2017-05-20: qty 5

## 2017-05-20 MED ORDER — ONDANSETRON HCL 4 MG/2ML IJ SOLN
INTRAMUSCULAR | Status: AC
  Filled 2017-05-20: qty 2

## 2017-05-20 MED ORDER — OXYCODONE HCL 5 MG PO TABS
5.0000 mg | ORAL_TABLET | Freq: Once | ORAL | Status: DC | PRN
  Filled 2017-05-20: qty 1

## 2017-05-20 MED ORDER — DEXAMETHASONE SODIUM PHOSPHATE 4 MG/ML IJ SOLN
INTRAMUSCULAR | Status: DC | PRN
  Administered 2017-05-20: 10 mg via INTRAVENOUS

## 2017-05-20 MED ORDER — DEXAMETHASONE SODIUM PHOSPHATE 10 MG/ML IJ SOLN
INTRAMUSCULAR | Status: AC
Start: 2017-05-20 — End: 2017-05-20
  Filled 2017-05-20: qty 1

## 2017-05-20 MED ORDER — OXYCODONE-ACETAMINOPHEN 5-325 MG PO TABS: 1.0000 | ORAL_TABLET | Freq: Four times a day (QID) | ORAL | 0 refills | Status: DC | PRN

## 2017-05-20 MED ORDER — FENTANYL CITRATE (PF) 100 MCG/2ML IJ SOLN
INTRAMUSCULAR | Status: DC | PRN
  Administered 2017-05-20: 100 ug via INTRAVENOUS

## 2017-05-20 MED ORDER — LIDOCAINE HCL (CARDIAC) PF 100 MG/5ML IV SOSY
PREFILLED_SYRINGE | INTRAVENOUS | Status: DC | PRN
  Administered 2017-05-20: 60 mg via INTRAVENOUS

## 2017-05-20 MED ORDER — PROPOFOL 10 MG/ML IV BOLUS
INTRAVENOUS | Status: AC
  Filled 2017-05-20: qty 20

## 2017-05-20 MED ORDER — KETOROLAC TROMETHAMINE 30 MG/ML IJ SOLN
30.0000 mg | Freq: Once | INTRAMUSCULAR | Status: DC | PRN
  Filled 2017-05-20: qty 1

## 2017-05-20 MED ORDER — PROPOFOL 10 MG/ML IV BOLUS
INTRAVENOUS | Status: DC | PRN
  Administered 2017-05-20: 200 mg via INTRAVENOUS

## 2017-05-20 MED ORDER — LACTATED RINGERS IV SOLN
INTRAVENOUS | Status: DC
  Administered 2017-05-20 (×2): via INTRAVENOUS
  Filled 2017-05-20: qty 1000

## 2017-05-20 MED ORDER — FENTANYL CITRATE (PF) 100 MCG/2ML IJ SOLN
25.0000 ug | INTRAMUSCULAR | Status: DC | PRN
  Filled 2017-05-20: qty 1

## 2017-05-20 MED ORDER — ONDANSETRON HCL 4 MG/2ML IJ SOLN
INTRAMUSCULAR | Status: DC | PRN
  Administered 2017-05-20: 4 mg via INTRAVENOUS

## 2017-05-20 MED ORDER — SODIUM CHLORIDE 0.9 % IR SOLN
Status: DC | PRN
  Administered 2017-05-20: 6000 mL

## 2017-05-20 MED ORDER — FENTANYL CITRATE (PF) 100 MCG/2ML IJ SOLN
INTRAMUSCULAR | Status: AC
  Filled 2017-05-20: qty 2

## 2017-05-20 MED ORDER — LACTATED RINGERS IV SOLN
INTRAVENOUS | Status: DC
  Administered 2017-05-20: 12:00:00 via INTRAVENOUS
  Filled 2017-05-20: qty 1000

## 2017-05-20 MED ORDER — LIDOCAINE HCL 1 % IJ SOLN
INTRAMUSCULAR | Status: DC | PRN
  Administered 2017-05-20: 20 mL

## 2017-05-20 MED ORDER — PROMETHAZINE HCL 25 MG/ML IJ SOLN
6.2500 mg | INTRAMUSCULAR | Status: DC | PRN
Start: 2017-05-20 — End: 2017-05-20
  Filled 2017-05-20: qty 1

## 2017-05-20 MED ORDER — OXYCODONE HCL 5 MG/5ML PO SOLN
5.0000 mg | Freq: Once | ORAL | Status: DC | PRN
  Filled 2017-05-20: qty 5

## 2017-05-20 SURGICAL SUPPLY — 17 items
BIPOLAR CUTTING LOOP 21FR (ELECTRODE)
CANISTER SUCT 3000ML PPV (MISCELLANEOUS) ×2 IMPLANT
CATH ROBINSON RED A/P 16FR (CATHETERS) IMPLANT
DEVICE MYOSURE REACH (MISCELLANEOUS) ×2 IMPLANT
DILATOR CANAL MILEX (MISCELLANEOUS) IMPLANT
GLOVE BIOGEL PI IND STRL 7.0 (GLOVE) ×1 IMPLANT
GLOVE BIOGEL PI INDICATOR 7.0 (GLOVE) ×1
GLOVE ECLIPSE 7.0 STRL STRAW (GLOVE) ×2 IMPLANT
GOWN STRL REUS W/TWL XL LVL3 (GOWN DISPOSABLE) ×4 IMPLANT
HANDPIECE ABLA MINERVA ENDO (MISCELLANEOUS) ×2 IMPLANT
LOOP CUTTING BIPOLAR 21FR (ELECTRODE) IMPLANT
PACK VAGINAL MINOR WOMEN LF (CUSTOM PROCEDURE TRAY) ×2 IMPLANT
PAD OB MATERNITY 4.3X12.25 (PERSONAL CARE ITEMS) ×2 IMPLANT
PAD PREP 24X48 CUFFED NSTRL (MISCELLANEOUS) ×2 IMPLANT
TOWEL OR 17X24 6PK STRL BLUE (TOWEL DISPOSABLE) ×4 IMPLANT
TUBING AQUILEX INFLOW (TUBING) ×2 IMPLANT
TUBING AQUILEX OUTFLOW (TUBING) ×2 IMPLANT

## 2017-05-20 NOTE — Anesthesia Procedure Notes (Signed)
Procedure Name: LMA Insertion Date/Time: 05/20/2017 2:06 PM Performed by: Maryella Shivers, CRNA Pre-anesthesia Checklist: Patient identified, Emergency Drugs available, Suction available and Patient being monitored Patient Re-evaluated:Patient Re-evaluated prior to induction Oxygen Delivery Method: Circle system utilized Preoxygenation: Pre-oxygenation with 100% oxygen Induction Type: IV induction Ventilation: Mask ventilation without difficulty LMA: LMA inserted LMA Size: 4.0 Number of attempts: 1 Airway Equipment and Method: Bite block Placement Confirmation: positive ETCO2 Tube secured with: Tape Dental Injury: Teeth and Oropharynx as per pre-operative assessment

## 2017-05-20 NOTE — Interval H&P Note (Signed)
History and Physical Interval Note:  05/20/2017 12:40 PM  Judith Wheeler  has presented today for surgery, with the diagnosis of DUB  The various methods of treatment have been discussed with the patient and family. After consideration of risks, benefits and other options for treatment, the patient has consented to  Procedure(s): DILATATION AND CURETTAGE /HYSTEROSCOPY WITH MINURVA (N/A) as a surgical intervention .  The patient's history has been reviewed, patient examined, no change in status, stable for surgery.  I have reviewed the patient's chart and labs.  Questions were answered to the patient's satisfaction.     [name]

## 2017-05-20 NOTE — Anesthesia Preprocedure Evaluation (Signed)
Anesthesia Evaluation  Patient identified by MRN, date of birth, ID band Patient awake    Reviewed: Allergy & Precautions, NPO status , Patient's Chart, lab work & pertinent test results  Airway Mallampati: II  TM Distance: >3 FB Neck ROM: Full    Dental no notable dental hx.    Pulmonary neg pulmonary ROS,    Pulmonary exam normal breath sounds clear to auscultation       Cardiovascular hypertension, Normal cardiovascular exam Rhythm:Regular Rate:Normal     Neuro/Psych negative neurological ROS  negative psych ROS   GI/Hepatic negative GI ROS, Neg liver ROS,   Endo/Other  negative endocrine ROS  Renal/GU negative Renal ROS  negative genitourinary   Musculoskeletal negative musculoskeletal ROS (+)   Abdominal   Peds negative pediatric ROS (+)  Hematology  (+) anemia ,   Anesthesia Other Findings   Reproductive/Obstetrics negative OB ROS                             Anesthesia Physical Anesthesia Plan  ASA: II  Anesthesia Plan: General   Post-op Pain Management:    Induction: Intravenous  PONV Risk Score and Plan: 3 and Ondansetron, Dexamethasone, Midazolam and Treatment may vary due to age or medical condition  Airway Management Planned: LMA  Additional Equipment:   Intra-op Plan:   Post-operative Plan: Extubation in OR  Informed Consent: I have reviewed the patients History and Physical, chart, labs and discussed the procedure including the risks, benefits and alternatives for the proposed anesthesia with the patient or authorized representative who has indicated his/her understanding and acceptance.   Dental advisory given  Plan Discussed with: CRNA and Surgeon  Anesthesia Plan Comments:         Anesthesia Quick Evaluation

## 2017-05-20 NOTE — Anesthesia Postprocedure Evaluation (Signed)
Anesthesia Post Note  Patient: Hydia Copelin  Procedure(s) Performed: DILATATION AND CURETTAGE /HYSTEROSCOPY WITH MINERVA (N/A Uterus) ENDOMETRIAL ABLATION (N/A Uterus)     Patient location during evaluation: PACU Anesthesia Type: General Level of consciousness: awake and alert and oriented Pain management: pain level controlled Vital Signs Assessment: post-procedure vital signs reviewed and stable Respiratory status: spontaneous breathing, nonlabored ventilation and respiratory function stable Cardiovascular status: blood pressure returned to baseline and stable Postop Assessment: no apparent nausea or vomiting Anesthetic complications: no    Last Vitals:  Vitals:   05/20/17 1600 05/20/17 1615  BP: (!) 184/115 (!) 182/105  Pulse: 99 75  Resp: 14 14  Temp:    SpO2: 100%     Last Pain:  Vitals:   05/20/17 1600  TempSrc:   PainSc: 0-No pain                 Rashawn Rayman A.

## 2017-05-20 NOTE — Discharge Instructions (Signed)
Hysteroscopy, Care After Refer to this sheet in the next few weeks. These instructions provide you with information on caring for yourself after your procedure. Your health care provider may also give you more specific instructions. Your treatment has been planned according to current medical practices, but problems sometimes occur. Call your health care provider if you have any problems or questions after your procedure. What can I expect after the procedure? After your procedure, it is typical to have the following:  You may have some cramping. This normally lasts for a couple days.  You may have bleeding. This can vary from light spotting for a few days to menstrual-like bleeding for 3-7 days.  Follow these instructions at home:  Rest for the first 1-2 days after the procedure.  Only take over-the-counter or prescription medicines as directed by your health care provider. Do not take aspirin. It can increase the chances of bleeding.  Take showers instead of baths for 2 weeks or as directed by your health care provider.  Do not drive for 24 hours or as directed.  Do not drink alcohol while taking pain medicine.  Do not use tampons, douche, or have sexual intercourse for 2 weeks or until your health care provider says it is okay.  Take your temperature twice a day for 4-5 days. Write it down each time.  Follow your health care provider's advice about diet, exercise, and lifting.  If you develop constipation, you may: ? Take a mild laxative if your health care provider approves. ? Add bran foods to your diet. ? Drink enough fluids to keep your urine clear or pale yellow.  Try to have someone with you or available to you for the first 24-48 hours, especially if you were given a general anesthetic.  Follow up with your health care provider as directed. Contact a health care provider if:  You feel dizzy or lightheaded.  You feel sick to your stomach (nauseous).  You have  abnormal vaginal discharge.  You have a rash.  You have pain that is not controlled with medicine. Get help right away if:  You have bleeding that is heavier than a normal menstrual period.  You have a fever.  You have increasing cramps or pain, not controlled with medicine.  You have new belly (abdominal) pain.  You pass out.  You have pain in the tops of your shoulders (shoulder strap areas).  You have shortness of breath. This information is not intended to replace advice given to you by your health care provider. Make sure you discuss any questions you have with your health care provider. Document Released: 11/03/2012 Document Revised: 06/21/2015 Document Reviewed: 08/12/2012 Elsevier Interactive Patient Education  2017 [facility] Instructions  Activity: Get plenty of rest for the remainder of the day. A responsible individual must stay with you for 24 hours following the procedure.  For the next 24 hours, DO NOT: -Drive a car -Paediatric nurse -Drink alcoholic beverages -Take any medication unless instructed by your physician -Make any legal decisions or sign important papers.  Meals: Start with liquid foods such as gelatin or soup. Progress to regular foods as tolerated. Avoid greasy, spicy, heavy foods. If nausea and/or vomiting occur, drink only clear liquids until the nausea and/or vomiting subsides. Call your physician if vomiting continues.  Special Instructions/Symptoms: Your throat may feel dry or sore from the anesthesia or the breathing tube placed in your throat during surgery. If this causes discomfort, gargle  with warm salt water. The discomfort should disappear within 24 hours. ° °If you had a scopolamine patch placed behind your ear for the management of post- operative nausea and/or vomiting: ° °1. The medication in the patch is effective for 72 hours, after which it should be removed.  Wrap patch in a tissue and discard in  the trash. Wash hands thoroughly with soap and water. °2. You may remove the patch earlier than 72 hours if you experience unpleasant side effects which may include dry mouth, dizziness or visual disturbances. °3. Avoid touching the patch. Wash your hands with soap and water after contact with the patch. °  ° ° °

## 2017-05-20 NOTE — Op Note (Signed)
PREOPERATIVE DIAGNOSIS:  Abnormal uterine bleeding  POSTOPERATIVE DIAGNOSIS: The same, endometrial fibroid  PROCEDURE:  Hysteroscopy,  Dilation and Curettage, Myosure resection of fibroid,  Minerva Endometrial Ablation  SURGEON:  Dr. [name]  INDICATIONS: 42 y.o. [phone] with abnormal bleeding noted to fail IUD for endometrial ablation. Risks of surgery were discussed with the patient including but not limited to: bleeding which may require transfusion; infection which may require antibiotics; injury to uterus leading to risk of injury to surrounding intraperitoneal organs, need for additional procedures including laparoscopy or laparotomy, and other postoperative/anesthesia complications. Written informed consent was obtained.   FINDINGS:  A 10 week size uterus.  Right lateral wall pedunculated fibroid  Normal ostia bilaterally.  ANESTHESIA:   General LMA and paracervical block with 20 ml of 1% Lidocaine  FLUID DEFICITS:  1061 ml of Lactated Ringers  ESTIMATED BLOOD LOSS:  Less than 20 ml  SPECIMENS: Endometrial curettings and myosure resection sent to pathology  COMPLICATIONS:  None immediate  PROCEDURE DETAILS:  The patient was taken to the operating room where general anesthesia was administered and was found to be adequate.  After an adequate timeout was performed, she was placed in the dorsal lithotomy position and examined; then prepped and draped in the sterile manner.  A speculum was then placed in the patient's vagina and a single tooth tenaculum was applied to the anterior lip of the cervix. A paracervical block using 1% Lidocaine was administered. The sound was used to obtain the cervical and uterine cavity length measurements at 4 cm and 5 cm respectively; total sounding length 9 cm. The cervix was dilated manually with dilators to accommodate the diagnostic hysteroscope. Once the cervix was dilated, the hysteroscope was inserted under direct visualization. The uterine cavity  was carefully examined, both ostia were recognized, and the above mentioned fibroid noted. The hysteroscope was removed and a Myosure inserted with removal of 90% of fibroid under direct visualization.    The Minerva device was inserted, and a cavity integrity test was passed. Using a power of 40 watts, for 120 sec, the endometrial ablation was performed. The hysteroscope was then re-introduced into the uterine cavity, confirming  ablation of the endometrium. The tenaculum was removed from the anterior lip of the cervix, and sharp curretage performed. Pressure used to stop bleeding from tenaculum sites. The vaginal speculum was removed after noting good hemostasis.  The patient tolerated the procedure well and was taken to the recovery area awake, extubated and in stable condition.  05/20/2017 3:12 PM [name], MD

## 2017-05-20 NOTE — Transfer of Care (Signed)
   Last Vitals:  Vitals Value Taken Time  BP    Temp    Pulse 90 05/20/2017  3:09 PM  Resp 13 05/20/2017  3:09 PM  SpO2 100 % 05/20/2017  3:09 PM  Vitals shown include unvalidated device data.  Last Pain:  Vitals:   05/20/17 1106  TempSrc: Oral         Immediate Anesthesia Transfer of Care Note  Patient: Judith Wheeler  Procedure(s) Performed: Procedure(s) (LRB): DILATATION AND CURETTAGE /HYSTEROSCOPY WITH MINERVA (N/A) ENDOMETRIAL ABLATION (N/A)  Patient Location: PACU  Anesthesia Type: General  Level of Consciousness: Drowsy  Airway & Oxygen Therapy: Patient Spontanous Breathing and Patient connected to nasal cannula oxygen  Post-op Assessment: Report given to PACU RN and Post -op Vital signs reviewed and stable  Post vital signs: Reviewed and stable  Complications: No apparent anesthesia complications

## 2017-05-21 ENCOUNTER — Encounter (HOSPITAL_BASED_OUTPATIENT_CLINIC_OR_DEPARTMENT_OTHER): Payer: Self-pay | Admitting: Family Medicine

## 2017-06-02 ENCOUNTER — Ambulatory Visit (INDEPENDENT_AMBULATORY_CARE_PROVIDER_SITE_OTHER): Payer: No Typology Code available for payment source | Admitting: Family Medicine

## 2017-06-02 ENCOUNTER — Encounter: Payer: Self-pay | Admitting: Family Medicine

## 2017-06-02 VITALS — BP 144/87 | HR 72 | Wt 182.4 lb

## 2017-06-02 DIAGNOSIS — Z09 Encounter for follow-up examination after completed treatment for conditions other than malignant neoplasm: Secondary | ICD-10-CM

## 2017-06-02 NOTE — Progress Notes (Signed)
   Subjective:    Patient ID: Judith Wheeler is a 42 y.o. female presenting with Post-op Follow-up (ABLATION)  on 06/02/2017  HPI: Doing well. Having some cramping occurs twice since procedure. Having discharge. Now 2 wks post op. Takes tylenol as needed. Avoids NSAIDS due to BP.  Review of Systems  Constitutional: Negative for chills and fever.  Respiratory: Negative for shortness of breath.   Cardiovascular: Negative for chest pain.  Gastrointestinal: Negative for abdominal pain, nausea and vomiting.  Genitourinary: Negative for dysuria.  Skin: Negative for rash.      Objective:    BP (!) 144/87   Pulse 72   Wt 182 lb 6.4 oz (82.7 kg)   BMI 31.31 kg/m  Physical Exam  Constitutional: She is oriented to person, place, and time. She appears well-developed and well-nourished. No distress.  HENT:  Head: Normocephalic and atraumatic.  Eyes: No scleral icterus.  Neck: Neck supple.  Cardiovascular: Normal rate.  Pulmonary/Chest: Effort normal.  Abdominal: Soft.  Neurological: She is alert and oriented to person, place, and time.  Skin: Skin is warm and dry.  Psychiatric: She has a normal mood and affect.        Assessment & Plan:   Postop check - doing well, should lose weight gained by Megace. What to expect reviewed.   Total face-to-face time with patient: 10 minutes. Over 50% of encounter was spent on counseling and coordination of care. Return in about 6 weeks (around 07/14/2017).  [name] 06/02/2017 10:19 AM

## 2017-08-24 ENCOUNTER — Other Ambulatory Visit: Payer: Self-pay | Admitting: Family Medicine

## 2017-08-24 DIAGNOSIS — Z1231 Encounter for screening mammogram for malignant neoplasm of breast: Secondary | ICD-10-CM

## 2017-09-15 ENCOUNTER — Ambulatory Visit (INDEPENDENT_AMBULATORY_CARE_PROVIDER_SITE_OTHER): Payer: No Typology Code available for payment source | Admitting: Family Medicine

## 2017-09-15 ENCOUNTER — Encounter: Payer: Self-pay | Admitting: Family Medicine

## 2017-09-15 ENCOUNTER — Encounter (INDEPENDENT_AMBULATORY_CARE_PROVIDER_SITE_OTHER): Payer: Self-pay

## 2017-09-15 ENCOUNTER — Ambulatory Visit
Admission: RE | Admit: 2017-09-15 | Discharge: 2017-09-15 | Disposition: A | Discharge status: Home / Self Care | Payer: No Typology Code available for payment source | Source: Ambulatory Visit | Attending: Family Medicine | Admitting: Family Medicine

## 2017-09-15 VITALS — BP 130/93 | HR 72 | Wt 181.8 lb

## 2017-09-15 DIAGNOSIS — Z1231 Encounter for screening mammogram for malignant neoplasm of breast: Secondary | ICD-10-CM | POA: Diagnosis not present

## 2017-09-15 DIAGNOSIS — Z23 Encounter for immunization: Secondary | ICD-10-CM | POA: Diagnosis not present

## 2017-09-15 DIAGNOSIS — N920 Excessive and frequent menstruation with regular cycle: Secondary | ICD-10-CM | POA: Diagnosis not present

## 2017-09-15 NOTE — Assessment & Plan Note (Signed)
Doing well. Return as needed.

## 2017-09-15 NOTE — Progress Notes (Signed)
   Subjective:    Patient ID: Judith Wheeler is a 42 y.o. female presenting with Follow-up  on 09/15/2017  HPI: S/p Minerva ablation and doing well. No cycle since the procedure. currently on NSAIDS for tendonitis.  Review of Systems  Constitutional: Negative for chills and fever.  Respiratory: Negative for shortness of breath.   Cardiovascular: Negative for chest pain.  Gastrointestinal: Negative for abdominal pain, nausea and vomiting.  Genitourinary: Negative for dysuria.  Skin: Negative for rash.      Objective:    BP (!) 130/93   Pulse 72   Wt 181 lb 12.8 oz (82.5 kg)   BMI 31.21 kg/m  Physical Exam  Constitutional: She is oriented to person, place, and time. She appears well-developed and well-nourished. No distress.  HENT:  Head: Normocephalic and atraumatic.  Eyes: No scleral icterus.  Neck: Neck supple.  Cardiovascular: Normal rate.  Pulmonary/Chest: Effort normal.  Abdominal: Soft.  Neurological: She is alert and oriented to person, place, and time.  Skin: Skin is warm and dry.  Psychiatric: She has a normal mood and affect.        Assessment & Plan:   Problem List Items Addressed This Visit      Unprioritized   Menorrhagia    Doing well. Return as needed.       Other Visit Diagnoses    Need for immunization against influenza    -  Primary   Relevant Orders   Flu Vaccine QUAD 36+ mos IM (Fluarix, Quad PF) (Completed)      Total face-to-face time with patient: 10 minutes. Over 50% of encounter was spent on counseling and coordination of care. Return if symptoms worsen or fail to improve.  [name] 09/15/2017 3:44 PM

## 2018-08-11 ENCOUNTER — Encounter: Payer: Self-pay | Admitting: Radiology

## 2018-10-26 ENCOUNTER — Ambulatory Visit (INDEPENDENT_AMBULATORY_CARE_PROVIDER_SITE_OTHER): Payer: No Typology Code available for payment source | Admitting: *Deleted

## 2018-10-26 ENCOUNTER — Other Ambulatory Visit: Payer: Self-pay | Admitting: Family Medicine

## 2018-10-26 ENCOUNTER — Other Ambulatory Visit: Payer: Self-pay

## 2018-10-26 VITALS — BP 137/93 | HR 76

## 2018-10-26 DIAGNOSIS — N898 Other specified noninflammatory disorders of vagina: Secondary | ICD-10-CM | POA: Diagnosis not present

## 2018-10-26 DIAGNOSIS — Z1231 Encounter for screening mammogram for malignant neoplasm of breast: Secondary | ICD-10-CM

## 2018-10-26 DIAGNOSIS — R3915 Urgency of urination: Secondary | ICD-10-CM

## 2018-10-26 DIAGNOSIS — N760 Acute vaginitis: Secondary | ICD-10-CM | POA: Diagnosis not present

## 2018-10-26 DIAGNOSIS — B9689 Other specified bacterial agents as the cause of diseases classified elsewhere: Secondary | ICD-10-CM | POA: Diagnosis not present

## 2018-10-26 LAB — POCT URINALYSIS DIPSTICK
Blood, UA: NEGATIVE
Leukocytes, UA: NEGATIVE

## 2018-10-26 NOTE — Progress Notes (Signed)
Patient seen and assessed by nursing staff.  Agree with documentation and plan.  

## 2018-10-26 NOTE — Progress Notes (Signed)
SUBJECTIVE:  43 y.o. female complains of vaginal itching and pelvic pressure x 3 weeks. Denies abnormal vaginal discharge,  bleeding or significant pelvic pain or fever. Pt also complains of urinary urgency and pressure. Denies history of known exposure to STD.  No LMP recorded. Patient has had an ablation.  OBJECTIVE:  She appears well, afebrile. Urine dipstick: negative for all components.  ASSESSMENT:  Vaginal itching   Pelvic pressure Urinary urgency   PLAN:  BVAG, CVAG probe sent to lab. Urine culture sent to lab. Treatment: To be determined once lab results are received ROV prn if symptoms persist or worsen.Pt has appointment for an annual in October.  Crosby Oyster, RN

## 2018-10-27 ENCOUNTER — Other Ambulatory Visit: Payer: Self-pay | Admitting: Family Medicine

## 2018-10-27 LAB — CERVICOVAGINAL ANCILLARY ONLY
Bacterial Vaginitis (gardnerella): POSITIVE — AB
Candida Glabrata: NEGATIVE
Candida Vaginitis: NEGATIVE
Molecular Disclaimer: NEGATIVE
Molecular Disclaimer: NEGATIVE
Molecular Disclaimer: NORMAL

## 2018-10-27 LAB — URINE CULTURE

## 2018-10-27 MED ORDER — METRONIDAZOLE 500 MG PO TABS: 500.0000 mg | ORAL_TABLET | Freq: Two times a day (BID) | ORAL | 0 refills | Status: DC

## 2018-11-03 ENCOUNTER — Encounter: Payer: Self-pay | Admitting: Family Medicine

## 2018-11-03 ENCOUNTER — Ambulatory Visit (INDEPENDENT_AMBULATORY_CARE_PROVIDER_SITE_OTHER): Payer: No Typology Code available for payment source | Admitting: Family Medicine

## 2018-11-03 ENCOUNTER — Other Ambulatory Visit: Payer: Self-pay

## 2018-11-03 VITALS — BP 132/89 | HR 80 | Ht 64.0 in | Wt 178.0 lb

## 2018-11-03 DIAGNOSIS — N898 Other specified noninflammatory disorders of vagina: Secondary | ICD-10-CM | POA: Diagnosis not present

## 2018-11-03 DIAGNOSIS — Z01419 Encounter for gynecological examination (general) (routine) without abnormal findings: Secondary | ICD-10-CM | POA: Diagnosis not present

## 2018-11-03 MED ORDER — FLUCONAZOLE 150 MG PO TABS: 150.0000 mg | ORAL_TABLET | Freq: Once | ORAL | 1 refills | Status: AC

## 2018-11-03 MED ORDER — NYSTATIN 100000 UNIT/GM EX POWD: Freq: Four times a day (QID) | CUTANEOUS | 1 refills | Status: DC

## 2018-11-03 NOTE — Progress Notes (Signed)
   GYNECOLOGY ANNUAL PREVENTATIVE CARE ENCOUNTER NOTE  Subjective:   Judith Wheeler is a 43 y.o. [phone] female here for a routine annual gynecologic exam.  Current complaints: vaginal/groin itching, severe. Treated for BV but has not helped.   Denies abnormal vaginal bleeding, discharge, pelvic pain, problems with intercourse or other gynecologic concerns.    Gynecologic History No LMP recorded. Patient has had an ablation. Contraception: tubal ligation Last Pap: 2019. Results were: normal Last mammogram: 2019. Results were: normal  The following portions of the patient's history were reviewed and updated as appropriate: allergies, current medications, past family history, past medical history, past social history, past surgical history and problem list.  Review of Systems Pertinent items are noted in HPI.   Objective:  BP 132/89   Pulse 80   Ht 5\' 4"  (1.626 m)   Wt 178 lb (80.7 kg)   BMI 30.55 kg/m  CONSTITUTIONAL: Well-developed, well-nourished female in no acute distress.  HENT:  Normocephalic, atraumatic, External right and left ear normal. Oropharynx is clear and moist EYES:  No scleral icterus.  NECK: Normal range of motion, supple, no masses.  Normal thyroid.  SKIN: Skin is warm and dry. No rash noted. Not diaphoretic. No erythema. No pallor. NEUROLOGIC: Alert and oriented to person, place, and time. Normal reflexes, muscle tone coordination. No cranial nerve deficit noted. PSYCHIATRIC: Normal mood and affect. Normal behavior. Normal judgment and thought content. CARDIOVASCULAR: Normal heart rate noted, regular rhythm. 2+ distal pulses. RESPIRATORY: Effort and breath sounds normal, no problems with respiration noted. BREASTS: Symmetric in size. No masses, skin changes, nipple drainage, or lymphadenopathy. ABDOMEN: Soft,  no distention noted.  No tenderness, rebound or guarding.  PELVIC: Normal appearing external genitalia- slight erythema on labia. No white plaques/lesions of  concern; normal appearing vaginal mucosa and cervix.  No abnormal discharge noted.  Normal uterine size, no other palpable masses, no uterine or adnexal tenderness. MUSCULOSKELETAL: Normal range of motion.   Assessment and Plan:  1) Annual gynecologic examination - pap not indicated. Routine preventative health maintenance measures emphasized.  Has prediabetes- recommended myo-instol for insulin resistance.  1. Well woman exam with routine gynecological exam Up to date on screening.  Reviewed diet/exercise and recommendations for weight loss for risk reduction.  Recommended  Mammogram screening and engagment with PCP  2. Vaginal itching [phone]) Will try antifungals to help with sx.  - fluconazole (DIFLUCAN) 150 MG tablet; Take 1 tablet (150 mg total) by mouth once for 1 dose. Can take additional dose three days later if symptoms persist  Dispense: 1 tablet; Refill: 1 - nystatin (MYCOSTATIN/NYSTOP) powder; Apply topically 4 (four) times daily.  Dispense: 15 g; Refill: 1   Please refer to After Visit Summary for other counseling recommendations.   Return in about 1 year (around 11/03/2019) for Yearly wellness exam.  Caren Macadam, MD, MPH, ABFM [facility]ending [facility]

## 2018-11-03 NOTE — Progress Notes (Signed)
Pap normal - 02/09/2017  Had lab work done at [facility] and came back prediabetic   Still having the itching on external vagina

## 2018-11-03 NOTE — Patient Instructions (Signed)
  myo-inositol- supplement for weight loss and insulin resistance

## 2018-11-09 ENCOUNTER — Ambulatory Visit
Admission: RE | Admit: 2018-11-09 | Discharge: 2018-11-09 | Disposition: A | Discharge status: Home / Self Care | Payer: No Typology Code available for payment source | Source: Ambulatory Visit | Attending: Family Medicine | Admitting: Family Medicine

## 2018-11-09 ENCOUNTER — Other Ambulatory Visit: Payer: Self-pay

## 2018-11-09 DIAGNOSIS — Z1231 Encounter for screening mammogram for malignant neoplasm of breast: Secondary | ICD-10-CM | POA: Diagnosis present

## 2018-11-16 ENCOUNTER — Ambulatory Visit: Payer: No Typology Code available for payment source | Admitting: Family Medicine

## 2019-08-06 IMAGING — MG MM DIGITAL SCREENING BILAT W/ TOMO W/ CAD
8 series · 8 of 24 positions shown · non-contrast
Comparison: Previous exam(s).

CLINICAL DATA: Screening.

EXAM:
DIGITAL SCREENING BILATERAL MAMMOGRAM WITH TOMO AND CAD

[L MLO synth-2D]
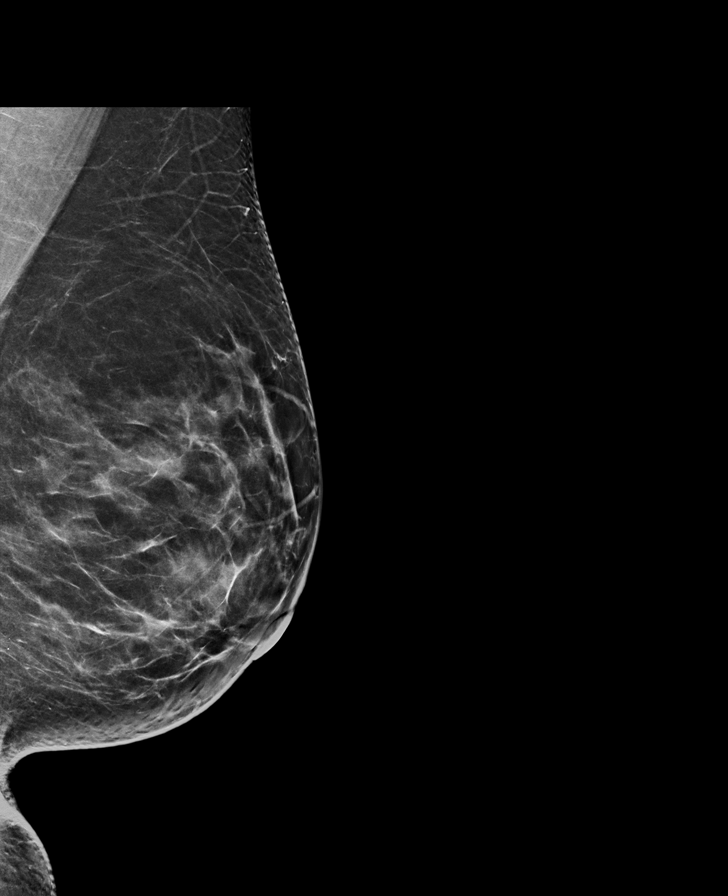

[R CC synth-2D]
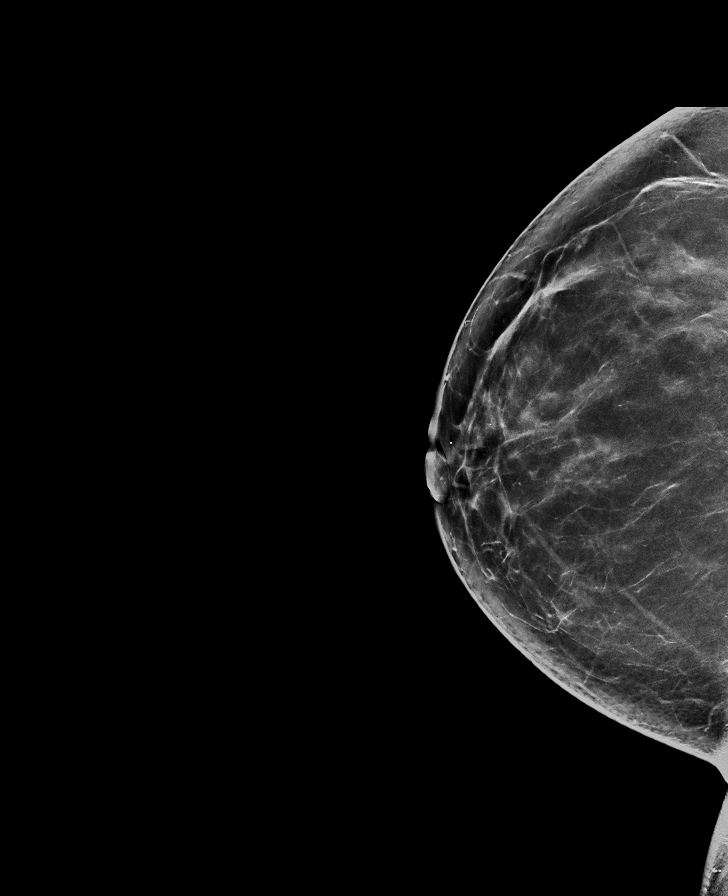

[L CC synth-2D]
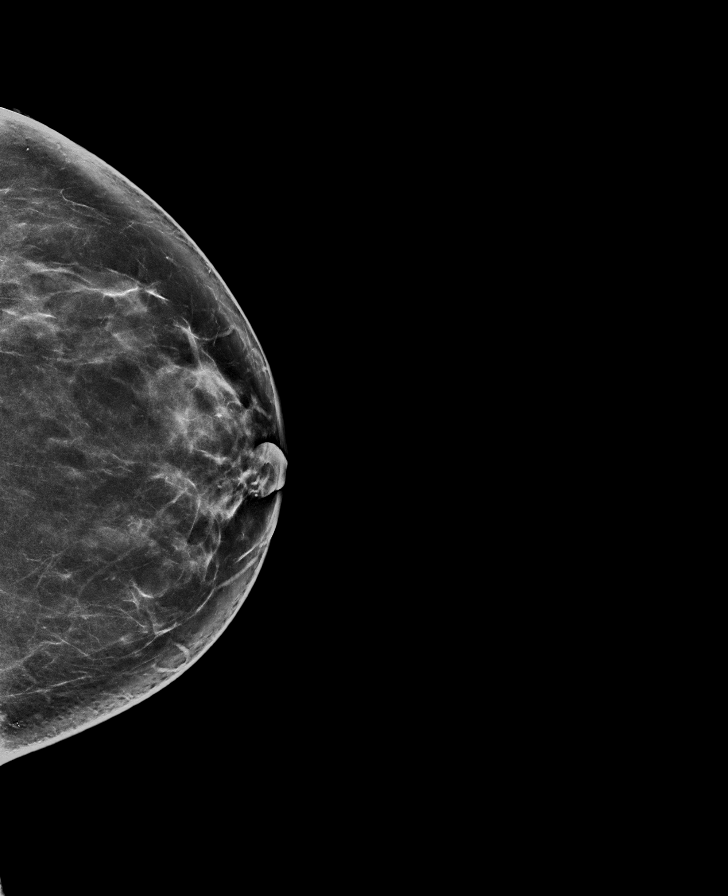

[R MLO synth-2D]
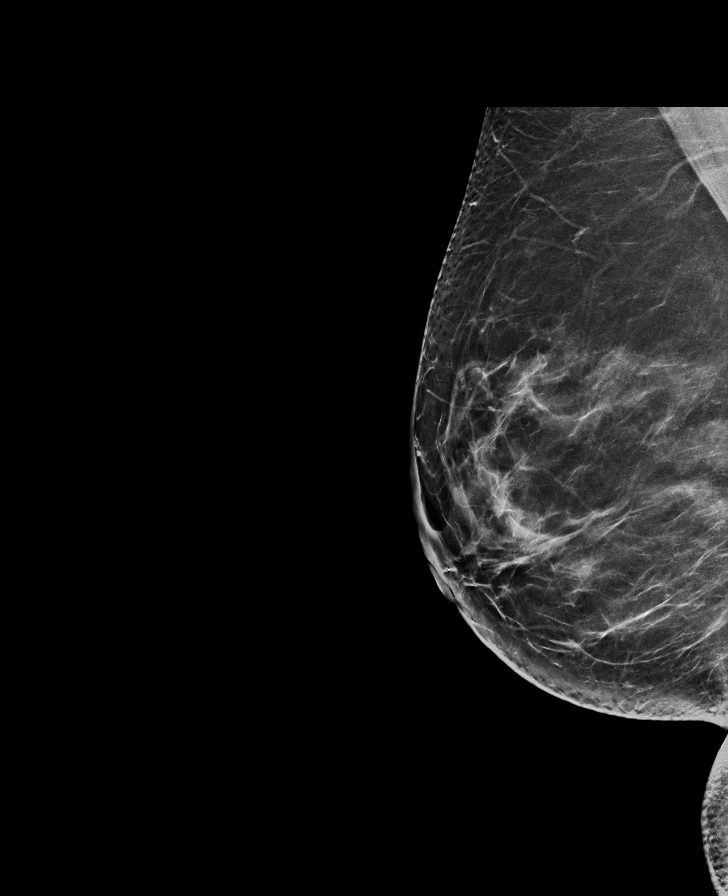

[L MLO tomo · tomo slice 38/75.0]
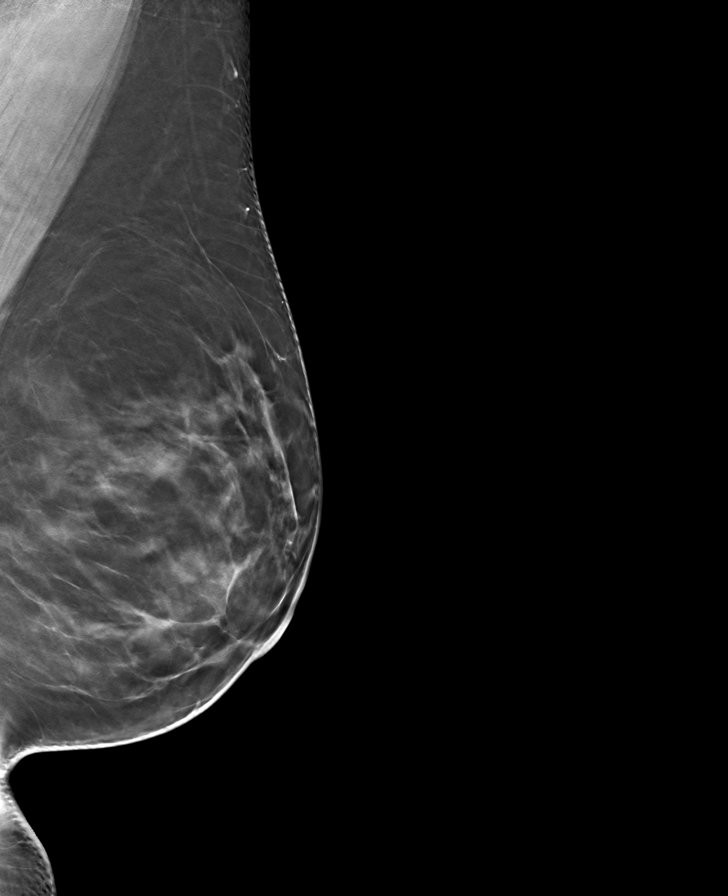

[R CC tomo · tomo slice 41/81.0]
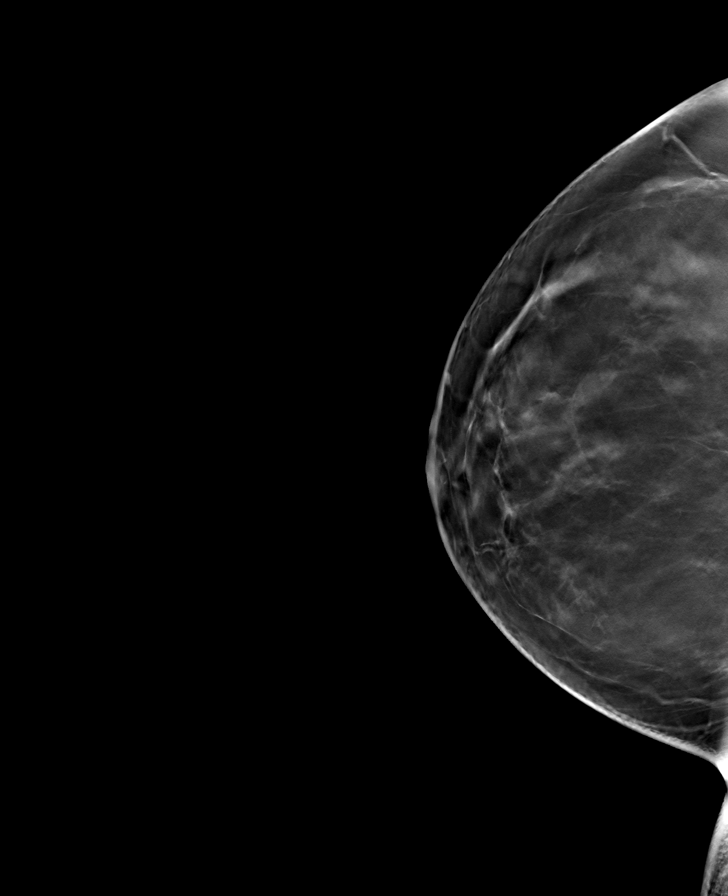

[R MLO tomo · tomo slice 41/81.0]
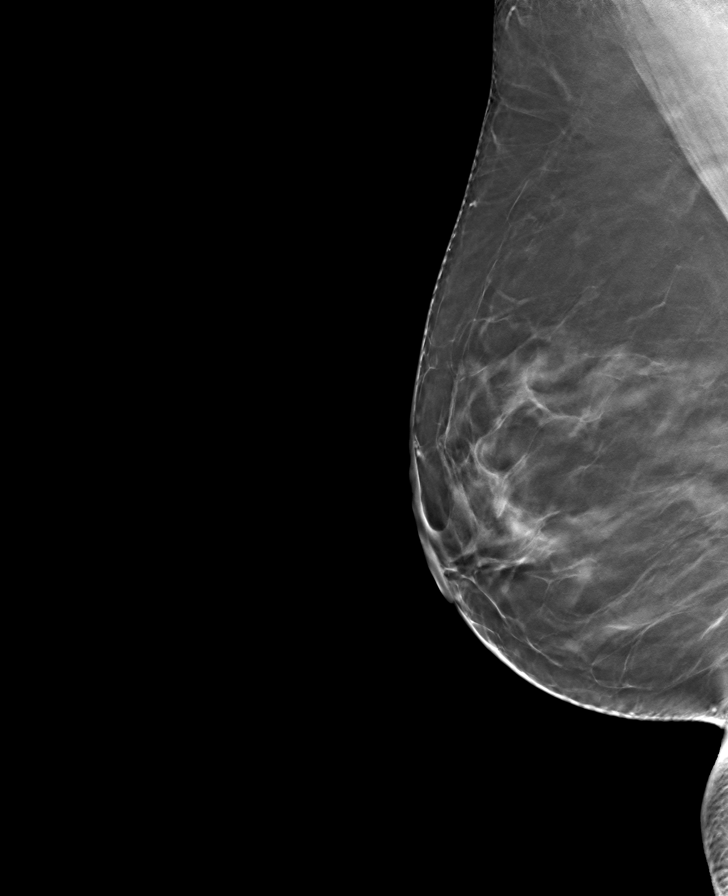

[L CC tomo · tomo slice 41/81.0]
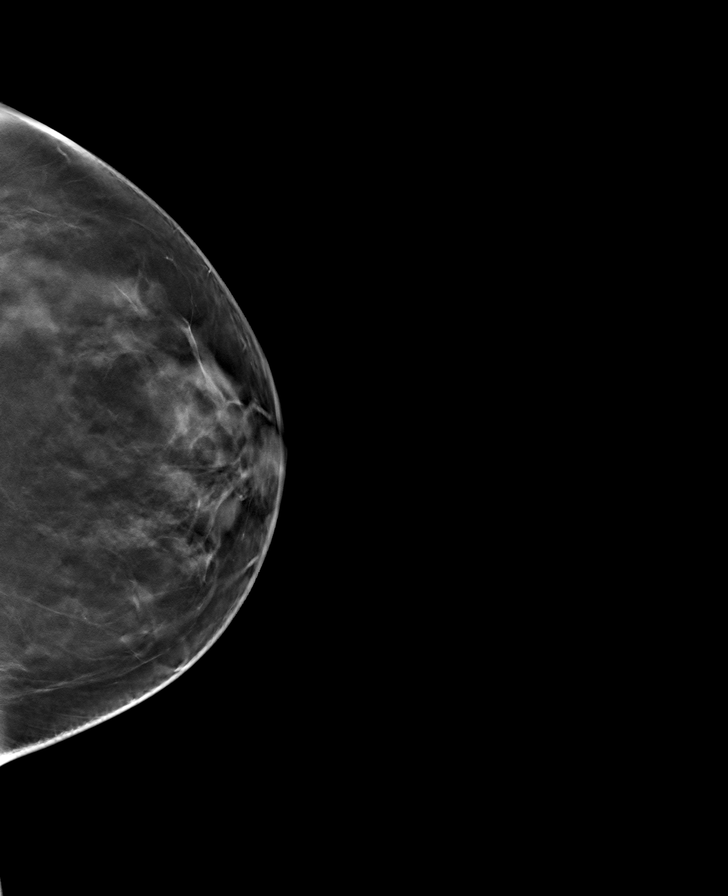

[8 of 24 positions shown; findings below may reference images not displayed]

ACR Breast Density Category b: There are scattered areas of
fibroglandular density.
FINDINGS: There are no findings suspicious for malignancy. Images were
processed with CAD.
IMPRESSION: No mammographic evidence of malignancy. A result letter of this
screening mammogram will be mailed directly to the patient.

RECOMMENDATION:
Screening mammogram in one year. (Code:[id number])

BI-RADS CATEGORY  1: Negative.

## 2019-09-26 ENCOUNTER — Other Ambulatory Visit: Payer: Self-pay | Admitting: Family Medicine

## 2019-09-26 DIAGNOSIS — Z1231 Encounter for screening mammogram for malignant neoplasm of breast: Secondary | ICD-10-CM

## 2019-10-07 ENCOUNTER — Encounter: Payer: Self-pay | Admitting: Radiology

## 2019-11-14 ENCOUNTER — Other Ambulatory Visit: Payer: Self-pay

## 2019-11-14 ENCOUNTER — Ambulatory Visit
Admission: RE | Admit: 2019-11-14 | Discharge: 2019-11-14 | Disposition: A | Discharge status: Home / Self Care | Payer: No Typology Code available for payment source | Source: Ambulatory Visit | Attending: Family Medicine | Admitting: Family Medicine

## 2019-11-14 DIAGNOSIS — Z1231 Encounter for screening mammogram for malignant neoplasm of breast: Secondary | ICD-10-CM | POA: Insufficient documentation

## 2020-01-30 ENCOUNTER — Telehealth: Payer: Self-pay | Admitting: *Deleted

## 2020-01-30 MED ORDER — FLUCONAZOLE 150 MG PO TABS: 150.0000 mg | ORAL_TABLET | Freq: Once | ORAL | 1 refills | Status: AC

## 2020-01-30 NOTE — Telephone Encounter (Signed)
Pt called stating she has a yeast infection and the OTC creams are not helping, will send in diflucan, and if not better pt is to come in for an appt.

## 2020-01-31 ENCOUNTER — Ambulatory Visit (INDEPENDENT_AMBULATORY_CARE_PROVIDER_SITE_OTHER): Payer: No Typology Code available for payment source

## 2020-01-31 ENCOUNTER — Other Ambulatory Visit (HOSPITAL_COMMUNITY)
Admission: RE | Admit: 2020-01-31 | Discharge: 2020-01-31 | Disposition: A | Discharge status: Home / Self Care | Payer: No Typology Code available for payment source | Source: Ambulatory Visit | Attending: Family Medicine | Admitting: Family Medicine

## 2020-01-31 ENCOUNTER — Other Ambulatory Visit: Payer: Self-pay

## 2020-01-31 VITALS — BP 119/88 | HR 84

## 2020-01-31 DIAGNOSIS — N898 Other specified noninflammatory disorders of vagina: Secondary | ICD-10-CM | POA: Diagnosis not present

## 2020-01-31 LAB — POCT URINALYSIS DIPSTICK
Glucose, UA: NEGATIVE
Protein, UA: NEGATIVE
Urobilinogen, UA: 0.2 E.U./dL
pH, UA: 6.5 (ref 5.0–8.0)

## 2020-01-31 NOTE — Progress Notes (Signed)
SUBJECTIVE:  45 y.o. female complains of vaginal itching x1 week.  Denies abnormal vaginal bleeding or significant pelvic pain or fever. No UTI symptoms, complains of dark urine. Denies history of known exposure to STD.  No LMP recorded. Patient has had an ablation.  OBJECTIVE:  She appears well, afebrile. Urine dipstick: negative for all components.  ASSESSMENT:  Vaginal Itching   PLAN:  GC, chlamydia, trichomonas, BVAG, CVAG probe sent to lab. Treatment: To be determined once lab results are received ROV prn if symptoms persist or worsen.

## 2020-01-31 NOTE — Progress Notes (Signed)
Patient seen and assessed by nursing staff.  Agree with documentation and plan.  

## 2020-02-01 ENCOUNTER — Other Ambulatory Visit: Payer: Self-pay | Admitting: *Deleted

## 2020-02-01 LAB — CERVICOVAGINAL ANCILLARY ONLY
Bacterial Vaginitis (gardnerella): POSITIVE — AB
Candida Glabrata: NEGATIVE
Candida Vaginitis: NEGATIVE
Chlamydia: NEGATIVE
Comment: NEGATIVE
Comment: NEGATIVE
Comment: NEGATIVE
Comment: NEGATIVE
Comment: NEGATIVE
Comment: NORMAL
Neisseria Gonorrhea: NEGATIVE
Trichomonas: NEGATIVE

## 2020-02-01 MED ORDER — METRONIDAZOLE 500 MG PO TABS: 500.0000 mg | ORAL_TABLET | Freq: Two times a day (BID) | ORAL | 0 refills | Status: DC

## 2020-02-01 MED ORDER — METRONIDAZOLE 500 MG PO TABS: 500.0000 mg | ORAL_TABLET | Freq: Two times a day (BID) | ORAL | 0 refills | Status: AC

## 2020-02-01 NOTE — Addendum Note (Signed)
Addended by: Reva Bores on: 02/01/2020 04:37 PM   Modules accepted: Orders

## 2020-02-01 NOTE — Progress Notes (Signed)
Pt out of town and wanted RX sent there

## 2020-04-23 ENCOUNTER — Ambulatory Visit: Payer: Self-pay | Admitting: Surgery

## 2020-04-23 NOTE — H&P (Signed)
Subjective:  CC: External hemorrhoids [K64.4]   HPI:  Judith Wheeler is a 45 y.o. female who was referrred by [employer] for above. Symptoms were first noted several years ago. Pain is discomfort, confined to the perianal area, without radiation.  Associated with nothing specific, exacerbated by extended sitting.  Toilet habits: Soft, BMs   Patient denies a personal history of colon cancer. Patient denies a personal history of IBD. Patient reports to history of polyps.     Past Medical History:  has a past medical history of Acne rosacea, Allergic state, Anemia (2019), Arrhythmia (2013), Hypertension, Melasma, Plantar fasciitis, Primary aldosteronism (CMS-HCC), and Seasonal allergies.  Past Surgical History:  has a past surgical history that includes Cesarean section and adrenal mass resection (2013 ).  Family History: family history includes Breast cancer in her maternal aunt; Colon cancer in her paternal uncle; Colon polyps in her father and mother; High blood pressure (Hypertension) in her father and mother; Stroke in her father.  Social History:  reports that she has never smoked. She has never used smokeless tobacco. She reports that she does not drink alcohol and does not use drugs.  Current Medications: has a current medication list which includes the following prescription(s): calcium, cetirizine, ferrous sulfate, fexofenadine hcl, fluticasone propionate, glycerin adult, hydrochlorothiazide, hydrocortisone, lisinopril, meclizine, meloxicam, multivitamin, and phenyleph/mineral oil/petrolat.  Allergies:  Allergies as of 04/23/2020 - Reviewed 04/23/2020  Allergen Reaction Noted  . Oxycodone Nausea 04/23/2020    ROS:  A 15 point review of systems was performed and pertinent positives and negatives noted in HPI  Objective:     BP (!) 137/96   Pulse 93   Ht 162.6 cm (5\' 4" )   Wt 74.8 kg (165 lb)   BMI 28.32 kg/m   Constitutional :  alert, appears stated age,  cooperative and no distress  Lymphatics/Throat::  no asymmetry, masses, or scars  Respiratory:  clear to auscultation bilaterally  Cardiovascular:  regular rate and rhythm  Gastrointestinal: soft, non-tender; bowel sounds normal; no masses,  no organomegaly.    Musculoskeletal: Steady gait and movement  Skin: Cool and mois  Psychiatric: Normal affect, non-agitated, not confused  Genital/Rectal: Chaperone present for exam.  External exam noted to have RP external hemorrhoids. DRE revealed, normal rectal tone, with palpable internal hemorrhoid noted at RP portion with minimal discomfort.  After obtaining verbal consent, an anoscope was inserted after prepped with lubricant and internal hemorrhoids noted on DRE confirmed visually, with no evidence of thrombosis. No other pathology such as fissures, fistulas, polyps noted.  Scope withdrawn and patient tolerate procedure well.     LABS:  N/A   RADS: N/A  Assessment:     External hemorrhoids [K64.4], grade I internal hemorrhoids Plan:    1. External hemorrhoids [K64.4] Discussed risks/benefits/alternatives to surgery.  Alternatives include the options of observation, medical management.  Benefits include symptomatic relief.  I discussed  in detail and the complications related to the operation and the anesthesia, including bleeding, infection, recurrence, remote possibility of temporary or permanent fecal incontinence, poor/delayed wound healing, chronic pain, and additional procedures to address said risks. The risks of general anesthetic, if used, includes MI, CVA, sudden death or even reaction to anesthetic medications also discussed.   We also discussed typical post operative recovery which includes weeks to potentially months of anal pain, drainage, occasional bleeding, and sense of fecal urgency.    ED return precautions given for sudden increase in pain, bleeding, with possible accompanying fever, nausea,  and/or vomiting.  The patient  understands the risks, any and all questions were answered to the patient's satisfaction.  2. Patient has elected to proceed with surgical treatment due to chronicity of symptoms.  Will need excision due to external component despite size of hemorrhoids. Procedure will be scheduled.  Written consent was obtained.

## 2020-04-23 NOTE — H&P (View-Only) (Signed)
Subjective:  CC: External hemorrhoids [K64.4]   HPI:  Judith Wheeler is a 45 y.o. female who was referrred by [employer] for above. Symptoms were first noted several years ago. Pain is discomfort, confined to the perianal area, without radiation.  Associated with nothing specific, exacerbated by extended sitting.  Toilet habits: Soft, BMs   Patient denies a personal history of colon cancer. Patient denies a personal history of IBD. Patient reports to history of polyps.     Past Medical History:  has a past medical history of Acne rosacea, Allergic state, Anemia (2019), Arrhythmia (2013), Hypertension, Melasma, Plantar fasciitis, Primary aldosteronism (CMS-HCC), and Seasonal allergies.  Past Surgical History:  has a past surgical history that includes Cesarean section and adrenal mass resection (2013 ).  Family History: family history includes Breast cancer in her maternal aunt; Colon cancer in her paternal uncle; Colon polyps in her father and mother; High blood pressure (Hypertension) in her father and mother; Stroke in her father.  Social History:  reports that she has never smoked. She has never used smokeless tobacco. She reports that she does not drink alcohol and does not use drugs.  Current Medications: has a current medication list which includes the following prescription(s): calcium, cetirizine, ferrous sulfate, fexofenadine hcl, fluticasone propionate, glycerin adult, hydrochlorothiazide, hydrocortisone, lisinopril, meclizine, meloxicam, multivitamin, and phenyleph/mineral oil/petrolat.  Allergies:  Allergies as of 04/23/2020 - Reviewed 04/23/2020  Allergen Reaction Noted  . Oxycodone Nausea 04/23/2020    ROS:  A 15 point review of systems was performed and pertinent positives and negatives noted in HPI  Objective:     BP (!) 137/96   Pulse 93   Ht 162.6 cm (5\' 4" )   Wt 74.8 kg (165 lb)   BMI 28.32 kg/m   Constitutional :  alert, appears stated age,  cooperative and no distress  Lymphatics/Throat::  no asymmetry, masses, or scars  Respiratory:  clear to auscultation bilaterally  Cardiovascular:  regular rate and rhythm  Gastrointestinal: soft, non-tender; bowel sounds normal; no masses,  no organomegaly.    Musculoskeletal: Steady gait and movement  Skin: Cool and mois  Psychiatric: Normal affect, non-agitated, not confused  Genital/Rectal: Chaperone present for exam.  External exam noted to have RP external hemorrhoids. DRE revealed, normal rectal tone, with palpable internal hemorrhoid noted at RP portion with minimal discomfort.  After obtaining verbal consent, an anoscope was inserted after prepped with lubricant and internal hemorrhoids noted on DRE confirmed visually, with no evidence of thrombosis. No other pathology such as fissures, fistulas, polyps noted.  Scope withdrawn and patient tolerate procedure well.     LABS:  N/A   RADS: N/A  Assessment:     External hemorrhoids [K64.4], grade I internal hemorrhoids Plan:    1. External hemorrhoids [K64.4] Discussed risks/benefits/alternatives to surgery.  Alternatives include the options of observation, medical management.  Benefits include symptomatic relief.  I discussed  in detail and the complications related to the operation and the anesthesia, including bleeding, infection, recurrence, remote possibility of temporary or permanent fecal incontinence, poor/delayed wound healing, chronic pain, and additional procedures to address said risks. The risks of general anesthetic, if used, includes MI, CVA, sudden death or even reaction to anesthetic medications also discussed.   We also discussed typical post operative recovery which includes weeks to potentially months of anal pain, drainage, occasional bleeding, and sense of fecal urgency.    ED return precautions given for sudden increase in pain, bleeding, with possible accompanying fever, nausea,  and/or vomiting.  The patient  understands the risks, any and all questions were answered to the patient's satisfaction.  2. Patient has elected to proceed with surgical treatment due to chronicity of symptoms.  Will need excision due to external component despite size of hemorrhoids. Procedure will be scheduled.  Written consent was obtained.

## 2020-04-30 ENCOUNTER — Other Ambulatory Visit: Payer: Self-pay

## 2020-04-30 ENCOUNTER — Other Ambulatory Visit
Admission: RE | Admit: 2020-04-30 | Discharge: 2020-04-30 | Disposition: A | Discharge status: Home / Self Care | Payer: No Typology Code available for payment source | Source: Ambulatory Visit | Attending: Surgery | Admitting: Surgery

## 2020-04-30 HISTORY — DX: Anemia, unspecified: D64.9

## 2020-04-30 HISTORY — DX: Prediabetes: R73.03

## 2020-04-30 NOTE — Patient Instructions (Signed)
Your procedure is scheduled on: Thursday May 10, 2020. Report to Day Surgery inside [facility] 2nd floor (stop by admissions desk first before getting on elevator). To find out your arrival time please call [phone] between 1PM - 3PM on Wednesday May 09, 2020.  Remember: Instructions that are not followed completely may result in serious medical risk,  up to and including death, or upon the discretion of your surgeon and anesthesiologist your  surgery may need to be rescheduled.     _X__ 1. Do not eat food after midnight the night before your procedure.                 No chewing gum or hard candies. You may drink clear liquids up to 2 hours                 before you are scheduled to arrive for your surgery- DO not drink clear                 liquids within 2 hours of the start of your surgery.                 Clear Liquids include:  water, apple juice without pulp, clear Gatorade, G2 or                  Gatorade Zero (avoid Red/Purple/Blue), Black Coffee or Tea (Do not add                 anything to coffee or tea).  __X__2.  On the morning of surgery brush your teeth with toothpaste and water, you                may rinse your mouth with mouthwash if you wish.  Do not swallow any toothpaste of mouthwash.     _X__ 3.  No Alcohol for 24 hours before or after surgery.   _X__ 4.  Do Not Smoke or use e-cigarettes For 24 Hours Prior to Your Surgery.                 Do not use any chewable tobacco products for at least 6 hours prior to                 Surgery.  _X__  5.  Do not use any recreational drugs (marijuana, cocaine, heroin, ecstasy, MDMA or other)                For at least one week prior to your surgery.  Combination of these drugs with anesthesia                May have life threatening results.  __X__6.  Notify your doctor if there is any change in your medical condition      (cold, fever, infections).     Do not wear jewelry, make-up,  hairpins, clips or nail polish. Do not wear lotions, powders, or perfumes. You may wear deodorant. Do not shave 48 hours prior to surgery. Men may shave face and neck. Do not bring valuables to the hospital.    [facility] is not responsible for any belongings or valuables.  Contacts, dentures or bridgework may not be worn into surgery. Leave your suitcase in the car. After surgery it may be brought to your room. For patients admitted to the hospital, discharge time is determined by your treatment team.   Patients discharged the day of surgery will not be allowed to drive home.  Make arrangements for someone to be with you for the first 24 hours of your Same Day Discharge.  __X__ Take these medicines the morning of surgery with A SIP OF WATER:    1. fexofenadine (ALLEGRA) 180 MG  2.   3.   4.  5.  6.  ____ Fleet Enema (as directed)   __X__ Use CHG Soap (or wipes) as directed  ____ Use Benzoyl Peroxide Gel as instructed  ____ Use inhalers on the day of surgery  ____ Stop metformin 2 days prior to surgery    ____ Take 1/2 of usual insulin dose the night before surgery. No insulin the morning          of surgery.   ____ Stop Coumadin/Plavix/aspirin   __X__ Stop Anti-inflammatories such as Ibuprofen, Aleve, Advil, naproxen, aspirin and or BC powders.    __X__ Stop supplements until after surgery.    __X__ Do not start any herbal supplements before your procedure.    If you have any questions regarding your pre-procedure instructions,  Please call Pre-admit Testing at [phone].

## 2020-05-08 ENCOUNTER — Other Ambulatory Visit: Payer: Self-pay

## 2020-05-08 ENCOUNTER — Other Ambulatory Visit
Admission: RE | Admit: 2020-05-08 | Discharge: 2020-05-08 | Disposition: A | Discharge status: Home / Self Care | Payer: No Typology Code available for payment source | Source: Ambulatory Visit | Attending: Surgery | Admitting: Surgery

## 2020-05-08 ENCOUNTER — Other Ambulatory Visit: Payer: No Typology Code available for payment source

## 2020-05-08 DIAGNOSIS — Z01818 Encounter for other preprocedural examination: Secondary | ICD-10-CM | POA: Insufficient documentation

## 2020-05-08 DIAGNOSIS — Z20822 Contact with and (suspected) exposure to covid-19: Secondary | ICD-10-CM | POA: Insufficient documentation

## 2020-05-08 DIAGNOSIS — I10 Essential (primary) hypertension: Secondary | ICD-10-CM | POA: Insufficient documentation

## 2020-05-08 LAB — BASIC METABOLIC PANEL
Anion gap: 8 (ref 5–15)
BUN: 15 mg/dL (ref 6–20)
CO2: 27 mmol/L (ref 22–32)
Calcium: 8.7 mg/dL — ABNORMAL LOW (ref 8.9–10.3)
Chloride: 102 mmol/L (ref 98–111)
Creatinine, Ser: 0.57 mg/dL (ref 0.44–1.00)
GFR, Estimated: >60 mL/min (ref >60)
Glucose, Bld: 130 mg/dL — ABNORMAL HIGH (ref 70–99)
Potassium: 3.4 mmol/L — ABNORMAL LOW (ref 3.5–5.1)
Sodium: 137 mmol/L (ref 135–145)

## 2020-05-08 LAB — CBC
HCT: 38 % (ref 36.0–46.0)
Hemoglobin: 13 g/dL (ref 12.0–15.0)
MCH: 29.6 pg (ref 26.0–34.0)
MCHC: 34.2 g/dL (ref 30.0–36.0)
MCV: 86.6 fL (ref 80.0–100.0)
Platelets: 268 10*3/uL (ref 150–400)
RBC: 4.39 MIL/uL (ref 3.87–5.11)
RDW: 12.6 % (ref 11.5–15.5)
WBC: 6.6 10*3/uL (ref 4.0–10.5)
nRBC: 0 % (ref 0.0–0.2)

## 2020-05-09 LAB — SARS CORONAVIRUS 2 (TAT 6-24 HRS): SARS Coronavirus 2: NEGATIVE

## 2020-05-10 ENCOUNTER — Ambulatory Visit
Admission: RE | Admit: 2020-05-10 | Discharge: 2020-05-10 | Disposition: A | Discharge status: Home / Self Care | Payer: No Typology Code available for payment source | Source: Ambulatory Visit | Attending: Surgery | Admitting: Surgery

## 2020-05-10 ENCOUNTER — Other Ambulatory Visit: Payer: Self-pay

## 2020-05-10 ENCOUNTER — Ambulatory Visit: Payer: No Typology Code available for payment source | Admitting: Anesthesiology

## 2020-05-10 ENCOUNTER — Encounter: Payer: Self-pay | Admitting: Surgery

## 2020-05-10 ENCOUNTER — Encounter
Admission: RE | Disposition: A | Discharge status: Home / Self Care | Payer: Self-pay | Source: Ambulatory Visit | Attending: Surgery

## 2020-05-10 DIAGNOSIS — I10 Essential (primary) hypertension: Secondary | ICD-10-CM | POA: Diagnosis not present

## 2020-05-10 DIAGNOSIS — Z8249 Family history of ischemic heart disease and other diseases of the circulatory system: Secondary | ICD-10-CM | POA: Insufficient documentation

## 2020-05-10 DIAGNOSIS — Z885 Allergy status to narcotic agent status: Secondary | ICD-10-CM | POA: Insufficient documentation

## 2020-05-10 DIAGNOSIS — K641 Second degree hemorrhoids: Secondary | ICD-10-CM | POA: Diagnosis not present

## 2020-05-10 DIAGNOSIS — K644 Residual hemorrhoidal skin tags: Secondary | ICD-10-CM | POA: Diagnosis not present

## 2020-05-10 HISTORY — PX: EVALUATION UNDER ANESTHESIA WITH HEMORRHOIDECTOMY: SHX5624

## 2020-05-10 LAB — GLUCOSE, CAPILLARY: Glucose-Capillary: 119 mg/dL — ABNORMAL HIGH (ref 70–99)

## 2020-05-10 LAB — POCT PREGNANCY, URINE: Preg Test, Ur: NEGATIVE

## 2020-05-10 SURGERY — EXAM UNDER ANESTHESIA WITH HEMORRHOIDECTOMY
Anesthesia: General | Site: Rectum

## 2020-05-10 MED ORDER — ACETAMINOPHEN 160 MG/5ML PO SOLN
325.0000 mg | ORAL | Status: DC | PRN
Start: 2020-05-10 — End: 2020-05-10
  Filled 2020-05-10: qty 20.3

## 2020-05-10 MED ORDER — KETOROLAC TROMETHAMINE 30 MG/ML IJ SOLN: 30.0000 mg | Freq: Once | INTRAMUSCULAR | Status: DC | PRN

## 2020-05-10 MED ORDER — MIDAZOLAM HCL 2 MG/2ML IJ SOLN
INTRAMUSCULAR | Status: AC
  Filled 2020-05-10: qty 2

## 2020-05-10 MED ORDER — FENTANYL CITRATE (PF) 100 MCG/2ML IJ SOLN
INTRAMUSCULAR | Status: AC
  Filled 2020-05-10: qty 2

## 2020-05-10 MED ORDER — BUPIVACAINE LIPOSOME 1.3 % IJ SUSP
INTRAMUSCULAR | Status: AC
  Filled 2020-05-10: qty 20

## 2020-05-10 MED ORDER — LIDOCAINE HCL (CARDIAC) PF 100 MG/5ML IV SOSY
PREFILLED_SYRINGE | INTRAVENOUS | Status: DC | PRN
  Administered 2020-05-10: 100 mg via INTRAVENOUS

## 2020-05-10 MED ORDER — ACETAMINOPHEN 500 MG PO TABS: 1000.0000 mg | ORAL_TABLET | ORAL | Status: AC

## 2020-05-10 MED ORDER — PROMETHAZINE HCL 25 MG/ML IJ SOLN
6.2500 mg | INTRAMUSCULAR | Status: DC | PRN
Start: 2020-05-10 — End: 2020-05-10

## 2020-05-10 MED ORDER — PROPOFOL 500 MG/50ML IV EMUL
INTRAVENOUS | Status: DC | PRN
  Administered 2020-05-10: 100 ug/kg/min via INTRAVENOUS

## 2020-05-10 MED ORDER — CHLORHEXIDINE GLUCONATE 0.12 % MT SOLN
OROMUCOSAL | Status: AC
  Administered 2020-05-10: 15 mL via OROMUCOSAL
  Filled 2020-05-10: qty 15

## 2020-05-10 MED ORDER — GELATIN ABSORBABLE 12-7 MM EX MISC
CUTANEOUS | Status: AC
  Filled 2020-05-10: qty 1

## 2020-05-10 MED ORDER — FAMOTIDINE 20 MG PO TABS: 20.0000 mg | ORAL_TABLET | Freq: Once | ORAL | Status: AC

## 2020-05-10 MED ORDER — CHLORHEXIDINE GLUCONATE 0.12 % MT SOLN: 15.0000 mL | Freq: Once | OROMUCOSAL | Status: AC

## 2020-05-10 MED ORDER — OXYMETAZOLINE HCL 0.05 % NA SOLN
NASAL | Status: AC
  Filled 2020-05-10: qty 30

## 2020-05-10 MED ORDER — DEXMEDETOMIDINE (PRECEDEX) IN NS 20 MCG/5ML (4 MCG/ML) IV SYRINGE
PREFILLED_SYRINGE | INTRAVENOUS | Status: DC | PRN
  Administered 2020-05-10: 8 ug via INTRAVENOUS

## 2020-05-10 MED ORDER — BUPIVACAINE LIPOSOME 1.3 % IJ SUSP
INTRAMUSCULAR | Status: DC | PRN
  Administered 2020-05-10: 20 mL

## 2020-05-10 MED ORDER — CHLORHEXIDINE GLUCONATE CLOTH 2 % EX PADS
6.0000 | MEDICATED_PAD | Freq: Once | CUTANEOUS | Status: AC
  Administered 2020-05-10: 6 via TOPICAL

## 2020-05-10 MED ORDER — PROPOFOL 500 MG/50ML IV EMUL
INTRAVENOUS | Status: AC
  Filled 2020-05-10: qty 50

## 2020-05-10 MED ORDER — GELATIN ABSORBABLE 100 CM EX MISC
CUTANEOUS | Status: AC
  Filled 2020-05-10: qty 1

## 2020-05-10 MED ORDER — BUPIVACAINE-EPINEPHRINE (PF) 0.5% -1:200000 IJ SOLN
INTRAMUSCULAR | Status: DC | PRN
  Administered 2020-05-10: 17 mL

## 2020-05-10 MED ORDER — PROPOFOL 10 MG/ML IV BOLUS
INTRAVENOUS | Status: DC | PRN
  Administered 2020-05-10: 10 mg via INTRAVENOUS
  Administered 2020-05-10: 20 mg via INTRAVENOUS

## 2020-05-10 MED ORDER — PHENYLEPHRINE HCL (PRESSORS) 10 MG/ML IV SOLN
INTRAVENOUS | Status: DC | PRN
  Administered 2020-05-10: 150 ug via INTRAVENOUS

## 2020-05-10 MED ORDER — BUPIVACAINE-EPINEPHRINE (PF) 0.5% -1:200000 IJ SOLN
INTRAMUSCULAR | Status: AC
  Filled 2020-05-10: qty 30

## 2020-05-10 MED ORDER — GABAPENTIN 300 MG PO CAPS: 300.0000 mg | ORAL_CAPSULE | ORAL | Status: AC

## 2020-05-10 MED ORDER — GABAPENTIN 300 MG PO CAPS
ORAL_CAPSULE | ORAL | Status: AC
  Administered 2020-05-10: 300 mg via ORAL
  Filled 2020-05-10: qty 1

## 2020-05-10 MED ORDER — FENTANYL CITRATE (PF) 100 MCG/2ML IJ SOLN
INTRAMUSCULAR | Status: DC | PRN
  Administered 2020-05-10 (×2): 25 ug via INTRAVENOUS

## 2020-05-10 MED ORDER — ONDANSETRON HCL 4 MG/2ML IJ SOLN
INTRAMUSCULAR | Status: DC | PRN
  Administered 2020-05-10: 4 mg via INTRAVENOUS

## 2020-05-10 MED ORDER — LACTATED RINGERS IV SOLN: INTRAVENOUS | Status: DC

## 2020-05-10 MED ORDER — DOCUSATE SODIUM 100 MG PO CAPS: 100.0000 mg | ORAL_CAPSULE | Freq: Two times a day (BID) | ORAL | 0 refills | Status: AC | PRN

## 2020-05-10 MED ORDER — CELECOXIB 200 MG PO CAPS
ORAL_CAPSULE | ORAL | Status: AC
  Administered 2020-05-10: 200 mg via ORAL
  Filled 2020-05-10: qty 1

## 2020-05-10 MED ORDER — FAMOTIDINE 20 MG PO TABS
ORAL_TABLET | ORAL | Status: AC
  Administered 2020-05-10: 20 mg via ORAL
  Filled 2020-05-10: qty 1

## 2020-05-10 MED ORDER — DEXMEDETOMIDINE (PRECEDEX) IN NS 20 MCG/5ML (4 MCG/ML) IV SYRINGE
PREFILLED_SYRINGE | INTRAVENOUS | Status: AC
  Filled 2020-05-10: qty 5

## 2020-05-10 MED ORDER — DROPERIDOL 2.5 MG/ML IJ SOLN
0.6250 mg | Freq: Once | INTRAMUSCULAR | Status: DC | PRN
  Filled 2020-05-10: qty 2

## 2020-05-10 MED ORDER — ACETAMINOPHEN 500 MG PO TABS
ORAL_TABLET | ORAL | Status: AC
  Administered 2020-05-10: 1000 mg via ORAL
  Filled 2020-05-10: qty 2

## 2020-05-10 MED ORDER — ACETAMINOPHEN 325 MG PO TABS: 325.0000 mg | ORAL_TABLET | ORAL | Status: DC | PRN

## 2020-05-10 MED ORDER — ORAL CARE MOUTH RINSE: 15.0000 mL | Freq: Once | OROMUCOSAL | Status: AC

## 2020-05-10 MED ORDER — DEXAMETHASONE SODIUM PHOSPHATE 10 MG/ML IJ SOLN
INTRAMUSCULAR | Status: DC | PRN
  Administered 2020-05-10: 5 mg via INTRAVENOUS

## 2020-05-10 MED ORDER — FENTANYL CITRATE (PF) 100 MCG/2ML IJ SOLN
25.0000 ug | INTRAMUSCULAR | Status: DC | PRN
  Administered 2020-05-10: 25 ug via INTRAVENOUS

## 2020-05-10 MED ORDER — TRAMADOL HCL 50 MG PO TABS: 50.0000 mg | ORAL_TABLET | Freq: Four times a day (QID) | ORAL | 0 refills | Status: AC | PRN

## 2020-05-10 MED ORDER — PROPOFOL 10 MG/ML IV BOLUS
INTRAVENOUS | Status: AC
  Filled 2020-05-10: qty 20

## 2020-05-10 MED ORDER — MIDAZOLAM HCL 2 MG/2ML IJ SOLN
INTRAMUSCULAR | Status: DC | PRN
  Administered 2020-05-10: 2 mg via INTRAVENOUS

## 2020-05-10 MED ORDER — CELECOXIB 200 MG PO CAPS: 200.0000 mg | ORAL_CAPSULE | ORAL | Status: AC

## 2020-05-10 SURGICAL SUPPLY — 30 items
BLADE SURG 15 STRL LF DISP TIS (BLADE) ×1 IMPLANT
BLADE SURG 15 STRL SS (BLADE) ×2
BRIEF STRETCH FOR OB PAD XXL (UNDERPADS AND DIAPERS) ×2 IMPLANT
CANISTER SUCT 1200ML W/VALVE (MISCELLANEOUS) ×2 IMPLANT
COVER WAND RF STERILE (DRAPES) ×2 IMPLANT
DRAPE PERI LITHO V/GYN (MISCELLANEOUS) ×2 IMPLANT
DRAPE UNDER BUTTOCK W/FLU (DRAPES) ×2 IMPLANT
DRSG GAUZE FLUFF 36X18 (GAUZE/BANDAGES/DRESSINGS) ×2 IMPLANT
ELECT REM PT RETURN 9FT ADLT (ELECTROSURGICAL) ×2
ELECTRODE REM PT RTRN 9FT ADLT (ELECTROSURGICAL) ×1 IMPLANT
GLOVE SURG SYN 6.5 ES PF (GLOVE) ×2 IMPLANT
GLOVE SURG UNDER POLY LF SZ7 (GLOVE) ×2 IMPLANT
GOWN STRL REUS W/ TWL LRG LVL3 (GOWN DISPOSABLE) ×2 IMPLANT
GOWN STRL REUS W/TWL LRG LVL3 (GOWN DISPOSABLE) ×4
KIT TURNOVER CYSTO (KITS) ×2 IMPLANT
LABEL OR SOLS (LABEL) ×2 IMPLANT
MANIFOLD NEPTUNE II (INSTRUMENTS) ×2 IMPLANT
NEEDLE HYPO 22GX1.5 SAFETY (NEEDLE) ×2 IMPLANT
NEEDLE HYPO 25X1 1.5 SAFETY (NEEDLE) ×2 IMPLANT
NS IRRIG 500ML POUR BTL (IV SOLUTION) ×2 IMPLANT
PACK BASIN MINOR ARMC (MISCELLANEOUS) ×2 IMPLANT
PAD PREP 24X41 OB/GYN DISP (PERSONAL CARE ITEMS) ×2 IMPLANT
SHEARS HARMONIC 9CM CVD (BLADE) IMPLANT
SOL PREP PVP 2OZ (MISCELLANEOUS) ×2
SOLUTION PREP PVP 2OZ (MISCELLANEOUS) ×1 IMPLANT
SURGILUBE 2OZ TUBE FLIPTOP (MISCELLANEOUS) ×2 IMPLANT
SUT VIC AB 3-0 SH 27 (SUTURE) ×2
SUT VIC AB 3-0 SH 27X BRD (SUTURE) ×1 IMPLANT
SYR 10ML LL (SYRINGE) ×2 IMPLANT
SYR 20ML LL LF (SYRINGE) ×2 IMPLANT

## 2020-05-10 NOTE — Discharge Instructions (Signed)
Hemorrhoidectomy, Care After This sheet gives you information about how to care for yourself after your procedure. Your health care provider may also give you more specific instructions. If you have problems or questions, contact your health care provider. What can I expect after the procedure? After the procedure, it is common to have:  Soreness.  Bruising.  Itching. Follow these instructions at home: site care Follow instructions from your health care provider about how to take care of your site. Make sure you:  Wash your hands with soap and water before and after you change your bandage (dressing). If soap and water are not available, use hand sanitizer.  Leave stitches (sutures), skin glue, or adhesive strips in place. These skin closures may need to stay in place for 2 weeks or longer. If adhesive strip edges start to loosen and curl up, you may trim the loose edges. Do not remove adhesive strips completely unless your health care provider tells you to do that.  If the area bleeds or bruises, apply gentle pressure for 10 minutes.  OK TO SHOWER IN 24HRS  Check your site every day for signs of infection. Check for:  Redness, swelling, or pain.  Fluid or blood.  Warmth.  Pus or a bad smell.  General instructions  Rest and then return to your normal activities as told by your health care provider. .  tylenol and mobic as needed for discomfort.  Please alternate between  as needed for pain.   .  Use narcotics, if prescribed, only when tylenol and motrin is not enough to control pain. .  325-650mg  every 8hrs to max of 3000mg /24hrs (including the 325mg  in every norco dose) for the tylenol.     Keep all follow-up visits as told by your health care provider. This is important. Contact a health care provider if:  You have redness, swelling, or pain around your site.  You have fluid or blood coming from your site.  Your site feels warm to the touch.  You have pus or a bad  smell coming from your site.  You have a fever.  Your sutures, skin glue, or adhesive strips loosen or come off sooner than expected. Get help right away if:  You have bleeding that does not stop with pressure or a dressing. Summary  After the procedure, it is common to have some soreness, bruising, and itching at the site.  Follow instructions from your health care provider about how to take care of your site.  Check your site every day for signs of infection.  Contact a health care provider if you have redness, swelling, or pain around your site, or your site feels warm to the touch.  Keep all follow-up visits as told by your health care provider. This is important. This information is not intended to replace advice given to you by your health care provider. Make sure you discuss any questions you have with your health care provider. Document Released: 02/09/2015 Document Revised: 07/13/2017 Document Reviewed: 07/13/2017 Elsevier Interactive Patient Education  2019 [place]   1) The drugs that you were given will stay in your system until tomorrow so for the next 24 hours you should not:  A) Drive an automobile B) Make any legal decisions C) Drink any alcoholic beverage   2) You may resume regular meals tomorrow.  Today it is better to start with liquids and gradually work up to solid foods.  You may eat anything you  prefer, but it is better to start with liquids, then soup and crackers, and gradually work up to solid foods.   3) Please notify your doctor immediately if you have any unusual bleeding, trouble breathing, redness and pain at the surgery site, drainage, fever, or pain not relieved by medication.    4) Additional Instructions:   Please contact your physician with any problems or Same Day Surgery at [phone], Monday through Friday 6 am to 4 pm, or [facility] number at  [phone].

## 2020-05-10 NOTE — Op Note (Addendum)
Preoperative diagnosis: Second degree hemorrhoids, external skin tag Postoperative diagnosis: Same Procedure: exam under anesthesia, 3 column hemorrhoidectomy.  Surgeon: Lysle Pearl  Anesthesia: general  Specimen: hemorrhoids x2  Complications: none  EBL: 46mL  Wound classification: Clean Contaminated  Indications: Patient is a 45 y.o. female was found to have symptomatic hemorrhoids refractory to medical management.   Findings: 1.  Second degree hemorrhoids 2. Internal and external anal sphincter palpated and preserved 3. Adequate hemostasis  Description of procedure: The patient was brought to the operating room and general anesthesia was induced. Patient was placed high lithotomy position. A time-out was completed verifying correct patient, procedure, site, positioning, and implant(s) and/or special equipment prior to beginning this procedure. The perineum was prepped and draped in standard sterile fashion. Local anesthetic was injected as a perianal block. An anoscope was introduced and hemorrhoidal pedicles identified.  A harmonic was placed across the base of a left posterior pedicle and excess hemorrhoidal tissue removed.  Specimen was passed off operative field pending pathology.  3-0 Vicryl then used to close the open wound.  A harmonic was placed across the base of a right anterior pedicle and excess hemorrhoidal tissue and associated skin tag removed.  Specimen was passed off operative field pending pathology.  3-0 Vicryl then used to close the open wound.  A harmonic was placed across the base of the right posterior anteriorpedicle and excess hemorrhoidal tissue removed.  Specimen was passed off operative field pending pathology.  3-0 Vicryl then used to close the open wound.  Hemostasis achieved with additional 3-0 vicryl suture in areas of bleeding until all active bleeding controlled.  Last inspection of the anal canal did not note any additional hemorrhoidal tissue and no  other pathology. Exparel injected as a perianal block. A gauze pad was tucked between the gluteal folds, and secured in place with mesh underwear.  The patient tolerated the procedure well and was taken to the postanesthesia care unit in stable condition.  Sponge and instrument count correct at end of procedure.

## 2020-05-10 NOTE — Anesthesia Preprocedure Evaluation (Signed)
Anesthesia Evaluation  Patient identified by MRN, date of birth, ID band Patient awake    Reviewed: Allergy & Precautions, H&P , NPO status , reviewed documented beta blocker date and time   Airway Mallampati: II  TM Distance: >3 FB     Dental  (+) Teeth Intact   Pulmonary    Pulmonary exam normal        Cardiovascular hypertension, Normal cardiovascular exam     Neuro/Psych    GI/Hepatic GERD  Medicated and Controlled,  Endo/Other    Renal/GU      Musculoskeletal   Abdominal   Peds  Hematology  (+) Blood dyscrasia, anemia ,   Anesthesia Other Findings Past Medical History: No date: Abnormal uterine bleeding (AUB) No date: Anemia No date: Hemorrhoids No date: Hypertension No date: Pre-diabetes No date: Primary aldosteronism [facility])     Comment:  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) No date: Seasonal allergies No date: Wears contact lenses  Past Surgical History: 12/ 2013     at [facility]: ADRENALECTOMY; Right 2013: adrenolecotmy x2 last one 2004: CESAREAN SECTION     Comment:  Bilateral Tubal Ligation w/ laset c/s No date: DILATION AND CURETTAGE OF UTERUS 05/20/2017: ENDOMETRIAL ABLATION; N/A     Comment:  Procedure: ENDOMETRIAL ABLATION;  Surgeon: [name], MD;  Location: [facility];  Service:               Gynecology;  Laterality: N/A; 05/20/2017: HYSTEROSCOPY WITH D & C; N/A     Comment:  Procedure: DILATATION AND CURETTAGE /HYSTEROSCOPY WITH               MINERVA;  Surgeon: [name], MD;  Location: [place];  Service: Gynecology;  Laterality:               N/A; 03-10-2011   [facility] (care everywhere): TRANSTHORACIC ECHOCARDIOGRAM     Comment:  mild LVH, ef >55%/  trivial PR and TR No date: TUBAL LIGATION  BMI    Body Mass Index: 28.84 kg/m      Reproductive/Obstetrics                              Anesthesia Physical Anesthesia Plan  ASA: II  Anesthesia Plan: General   Post-op Pain Management:    Induction: Intravenous  PONV Risk Score and Plan: 3 and Treatment may vary due to age or medical condition, TIVA, Ondansetron and Midazolam  Airway Management Planned: Nasal Cannula and Natural Airway  Additional Equipment:   Intra-op Plan:   Post-operative Plan:   Informed Consent: I have reviewed the patients History and Physical, chart, labs and discussed the procedure including the risks, benefits and alternatives for the proposed anesthesia with the patient or authorized representative who has indicated his/her understanding and acceptance.     Dental Advisory Given  Plan Discussed with: CRNA  Anesthesia Plan Comments: (LMA if required)        Anesthesia Quick Evaluation

## 2020-05-10 NOTE — Anesthesia Procedure Notes (Signed)
Date/Time: 05/10/2020 9:10 AM Performed by: Allean Found, CRNA Pre-anesthesia Checklist: Patient identified, Emergency Drugs available, Suction available, Patient being monitored and Timeout performed Patient Re-evaluated:Patient Re-evaluated prior to induction Oxygen Delivery Method: Nasal cannula Induction Type: IV induction Placement Confirmation: CO2 detector

## 2020-05-10 NOTE — Interval H&P Note (Signed)
History and Physical Interval Note:  05/10/2020 8:36 AM  Judith Wheeler  has presented today for surgery, with the diagnosis of K64.4 external hemorrhoids.  The various methods of treatment have been discussed with the patient and family. After consideration of risks, benefits and other options for treatment, the patient has consented to  Procedure(s): EXAM UNDER ANESTHESIA WITH HEMORRHOIDECTOMY (N/A) as a surgical intervention.  The patient's history has been reviewed, patient examined, no change in status, stable for surgery.  I have reviewed the patient's chart and labs.  Questions were answered to the patient's satisfaction.     Kartier Bennison Lysle Pearl

## 2020-05-10 NOTE — Transfer of Care (Signed)
Immediate Anesthesia Transfer of Care Note  Patient: Judith Wheeler  Procedure(s) Performed: EXAM UNDER ANESTHESIA WITH HEMORRHOIDECTOMY (N/A Rectum)  Patient Location: PACU  Anesthesia Type:General  Level of Consciousness: sedated  Airway & Oxygen Therapy: Patient Spontanous Breathing and Patient connected to nasal cannula oxygen  Post-op Assessment: Report given to RN and Post -op Vital signs reviewed and stable  Post vital signs: Reviewed and stable  Last Vitals:  Vitals Value Taken Time  BP 146/89 05/10/20 1122  Temp 36.8 C 05/10/20 1122  Pulse 66 05/10/20 1122  Resp 16 05/10/20 1122  SpO2 100 % 05/10/20 1122    Last Pain:  Vitals:   05/10/20 1122  TempSrc: Temporal  PainSc:          Complications: No complications documented.

## 2020-05-11 ENCOUNTER — Encounter: Payer: Self-pay | Admitting: Surgery

## 2020-05-11 LAB — SURGICAL PATHOLOGY

## 2020-05-17 NOTE — Anesthesia Postprocedure Evaluation (Signed)
Anesthesia Post Note  Patient: Judith Wheeler  Procedure(s) Performed: EXAM UNDER ANESTHESIA WITH HEMORRHOIDECTOMY (N/A Rectum)  Patient location during evaluation: PACU Anesthesia Type: General Level of consciousness: awake and alert Pain management: pain level controlled Vital Signs Assessment: post-procedure vital signs reviewed and stable Respiratory status: spontaneous breathing, nonlabored ventilation and respiratory function stable Cardiovascular status: blood pressure returned to baseline and stable Postop Assessment: no apparent nausea or vomiting Anesthetic complications: no   No complications documented.   Last Vitals:  Vitals:   05/10/20 1100 05/10/20 1122  BP: (!) 142/87 (!) 146/89  Pulse: 70 66  Resp: 15 16  Temp: 36.6 C 36.8 C  SpO2: 99% 100%    Last Pain:  Vitals:   05/11/20 0823  TempSrc:   PainSc: 0-No pain                 Alphonsus Sias

## 2020-05-28 ENCOUNTER — Ambulatory Visit: Payer: No Typology Code available for payment source | Admitting: Family Medicine

## 2020-07-07 ENCOUNTER — Ambulatory Visit
Admission: EM | Admit: 2020-07-07 | Discharge: 2020-07-07 | Disposition: A | Discharge status: Home / Self Care | Payer: No Typology Code available for payment source | Attending: Physician Assistant | Admitting: Physician Assistant

## 2020-07-07 ENCOUNTER — Encounter: Payer: Self-pay | Admitting: Emergency Medicine

## 2020-07-07 ENCOUNTER — Other Ambulatory Visit: Payer: Self-pay

## 2020-07-07 DIAGNOSIS — N760 Acute vaginitis: Secondary | ICD-10-CM | POA: Diagnosis present

## 2020-07-07 DIAGNOSIS — R102 Pelvic and perineal pain: Secondary | ICD-10-CM

## 2020-07-07 DIAGNOSIS — N898 Other specified noninflammatory disorders of vagina: Secondary | ICD-10-CM | POA: Diagnosis present

## 2020-07-07 LAB — POCT URINALYSIS DIP (DEVICE)
Bilirubin Urine: NEGATIVE
Glucose, UA: NEGATIVE mg/dL
Hgb urine dipstick: NEGATIVE
Ketones, ur: NEGATIVE mg/dL
Leukocytes,Ua: NEGATIVE
Nitrite: NEGATIVE
Protein, ur: NEGATIVE mg/dL
Specific Gravity, Urine: 1.025 (ref 1.005–1.030)
Urobilinogen, UA: 0.2 mg/dL (ref 0.0–1.0)
pH: 6.5 (ref 5.0–8.0)

## 2020-07-07 MED ORDER — FLUCONAZOLE 150 MG PO TABS: 150.0000 mg | ORAL_TABLET | ORAL | 0 refills | Status: AC | PRN

## 2020-07-07 NOTE — ED Provider Notes (Signed)
[facility]    CSN: [id number] Arrival date & time: 07/07/20  1415      History   Chief Complaint Chief Complaint  Patient presents with   Abdominal Pain   Vaginal Discharge    HPI Judith Wheeler is a 45 y.o. female presenting for lower abdominal pressure and cramping as well as urinary urgency and vaginal itching over the past 3 days.  Patient concerned about possible UTI or yeast infection.  She states that she had both a UTI and a yeast infection last month.  She denies any dysuria or blood in the urine.  No significant pain.  She has had some whitish vaginal discharge without odor.  Not taking any OTC meds for symptoms.  Denies any fever, chills, flank or back pain.  Patient not concern for STIs.  No other complaints.  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[place]) 02/19/2017   Plantar fasciitis 02/19/2017   Melasma 02/19/2017   Acne rosacea 02/19/2017   Essential hypertension 04/22/2014    Past Surgical History:  Procedure Laterality Date   ADRENALECTOMY Right 12/ 2013     at [facility]   adrenolecotmy  2013   [place]  x2 last one 2004   Bilateral Tubal Ligation w/ laset c/s   DILATION AND CURETTAGE OF UTERUS     ENDOMETRIAL ABLATION N/A 05/20/2017   Procedure: ENDOMETRIAL ABLATION;  Surgeon: [name], MD;  Location: [place];  Service: Gynecology;  Laterality: N/A;   EVALUATION UNDER ANESTHESIA WITH HEMORRHOIDECTOMY N/A 05/10/2020   Procedure: EXAM UNDER ANESTHESIA WITH HEMORRHOIDECTOMY;  Surgeon: Benjamine Sprague, DO;  Location: [facility];  Service: General;   Laterality: N/A;   HYSTEROSCOPY WITH D & C N/A 05/20/2017   Procedure: DILATATION AND CURETTAGE /HYSTEROSCOPY WITH MINERVA;  Surgeon: [name], MD;  Location: [place];  Service: Gynecology;  Laterality: N/A;   TRANSTHORACIC ECHOCARDIOGRAM  03-10-2011   [facility] (care everywhere)   mild LVH, ef >55%/  trivial PR and TR   TUBAL LIGATION      OB History     Gravida  3   Para      Term      Preterm      AB  1   Living  2      SAB  1   IAB      Ectopic      Multiple      Live Births               Home Medications    Prior to Admission medications   Medication Sig Start Date End Date Taking? Authorizing Provider  ferrous sulfate 325 (65 FE) MG tablet Take 325 mg by mouth daily.   Yes [provider]  fluconazole (DIFLUCAN) 150 MG tablet Take 1 tablet (150 mg total) by mouth every 3 (three) days as needed for up to 7 days (yeast infection). 07/07/20 07/14/20 Yes Danton Clap, PA-C  hydrochlorothiazide (HYDRODIURIL) 25 MG tablet Take 25 mg by mouth  daily.   Yes [provider]  lisinopril (PRINIVIL,ZESTRIL) 10 MG tablet Take 10 mg by mouth daily. 12/21/16  Yes [provider]  Multiple Vitamin (MULTIVITAMIN ADULT PO) Take by mouth.   Yes [provider]  acetaminophen (TYLENOL) 500 MG tablet Take 1,000 mg by mouth every 6 (six) hours as needed for moderate pain or headache.    [provider]  fexofenadine (ALLEGRA) 180 MG tablet Take 180 mg by mouth daily as needed for allergies or rhinitis.    [provider]  meclizine (ANTIVERT) 25 MG tablet Take 25 mg by mouth 3 (three) times daily as needed for dizziness.    [provider]  meloxicam (MOBIC) 7.5 MG tablet Take 7.5 mg by mouth daily as needed for pain.    [provider]  METAMUCIL FIBER PO Take 5-6 capsules by mouth daily as needed (constipation).    [provider]  traMADol (ULTRAM) 50 MG tablet Take 1 tablet  (50 mg total) by mouth every 6 (six) hours as needed (pain not controlled by other meds). 05/10/20 05/10/21  Benjamine Sprague, DO    Family History Family History  Problem Relation Age of Onset   Breast cancer Maternal Aunt 88    Social History Social History   Tobacco Use   Smoking status: Never   Smokeless tobacco: Never  Vaping Use   Vaping Use: Never used  Substance Use Topics   Alcohol use: No   Drug use: No     Allergies   Oxycodone   Review of Systems Review of Systems  Constitutional:  Negative for chills, fatigue and fever.  Gastrointestinal:  Positive for abdominal pain. Negative for diarrhea, nausea and vomiting.  Genitourinary:  Positive for urgency and vaginal discharge. Negative for decreased urine volume, dysuria, flank pain, frequency, hematuria, pelvic pain, vaginal bleeding and vaginal pain.  Musculoskeletal:  Negative for back pain.  Skin:  Negative for rash.    Physical Exam Triage Vital Signs ED Triage Vitals  Enc Vitals Group     BP 07/07/20 1529 126/86     Pulse Rate 07/07/20 1529 99     Resp 07/07/20 1529 14     Temp 07/07/20 1529 98.3 F (36.8 C)     Temp Source 07/07/20 1529 Oral     SpO2 07/07/20 1529 99 %     Weight 07/07/20 1526 170 lb (77.1 kg)     Height 07/07/20 1526 5\' 4"  (1.626 m)     Head Circumference --      Peak Flow --      Pain Score 07/07/20 1526 7     Pain Loc --      Pain Edu? --      Excl. in [place]? --    No data found.  Updated Vital Signs BP 126/86 (BP Location: Left Arm)   Pulse 99   Temp 98.3 F (36.8 C) (Oral)   Resp 14   Ht 5\' 4"  (1.626 m)   Wt 170 lb (77.1 kg)   SpO2 99%   BMI 29.18 kg/m       Physical Exam Vitals and nursing note reviewed.  Constitutional:      General: She is not in acute distress.    Appearance: Normal appearance. She is not ill-appearing or toxic-appearing.  HENT:     Head: Normocephalic and atraumatic.  Eyes:     General: No scleral icterus.       Right eye: No discharge.  Left eye: No discharge.     Conjunctiva/sclera: Conjunctivae normal.  Cardiovascular:     Rate and Rhythm: Normal rate and regular rhythm.     Heart sounds: Normal heart sounds.  Pulmonary:     Effort: Pulmonary effort is normal. No respiratory distress.     Breath sounds: Normal breath sounds.  Abdominal:     Palpations: Abdomen is soft.     Tenderness: There is no abdominal tenderness. There is no right CVA tenderness or left CVA tenderness.  Musculoskeletal:     Cervical back: Neck supple.  Skin:    General: Skin is dry.  Neurological:     General: No focal deficit present.     Mental Status: She is alert. Mental status is at baseline.     Motor: No weakness.     Gait: Gait normal.  Psychiatric:        Mood and Affect: Mood normal.        Behavior: Behavior normal.        Thought Content: Thought content normal.     UC Treatments / Results  Labs (all labs ordered are listed, but only abnormal results are displayed) Labs Reviewed  URINE CULTURE  POCT URINALYSIS DIPSTICK, ED / UC  POCT URINALYSIS DIP (DEVICE)  CERVICOVAGINAL ANCILLARY ONLY    EKG   Radiology No results found.  Procedures Procedures (including critical care time)  Medications Ordered in UC Medications - No data to display  Initial Impression / Assessment and Plan / UC Course  I have reviewed the triage vital signs and the nursing notes.  Pertinent labs & imaging results that were available during my care of the patient were reviewed by me and considered in my medical decision making (see chart for details).  45 year old female presenting for a bladder pressure, urinary urgency and vaginal itching as well as white vaginal discharge for the past 3 to 4 days.  Urinalysis performed today is within normal limits.  We will send urine for culture and treat for UTI if culture is positive.  Patient elected to perform a self swab.  Swab sent for BV and yeast testing.  Treating patient at this  time for yeast infection given her symptoms.  Have sent Diflucan to pharmacy.  Advised her to increase rest and fluids and take over-the-counter AZO if she is her to have burning symptoms.  Otherwise follow-up with our department as needed.  Final Clinical Impressions(s) / UC Diagnoses   Final diagnoses:  Acute vaginitis  Vaginal discharge     Discharge Instructions      The UA appears abnormal, but I am going to send the urine for culture.  If bacteria grows we can send antibiotic.  Your symptoms are most consistent with vaginal yeast infection.  I have sent Diflucan.  The vaginal swab you performed takes about 3 to 5 days to return.  At this time I would increase rest and fluids.  If you start to have burning sensation you can try over-the-counter AZO while we are awaiting urine culture result.     ED Prescriptions     Medication Sig Dispense Auth. Provider   fluconazole (DIFLUCAN) 150 MG tablet Take 1 tablet (150 mg total) by mouth every 3 (three) days as needed for up to 7 days (yeast infection). 2 tablet Gretta Cool      PDMP not reviewed this encounter.   Danton Clap, PA-C 07/07/20 1612

## 2020-07-07 NOTE — ED Triage Notes (Signed)
Patient c/o bladder pressure and urinary urgency that started on Wed.  Patient also reports lower abdominal discomfort.

## 2020-07-07 NOTE — Discharge Instructions (Addendum)
The UA appears abnormal, but I am going to send the urine for culture.  If bacteria grows we can send antibiotic.  Your symptoms are most consistent with vaginal yeast infection.  I have sent Diflucan.  The vaginal swab you performed takes about 3 to 5 days to return.  At this time I would increase rest and fluids.  If you start to have burning sensation you can try over-the-counter AZO while we are awaiting urine culture result.

## 2020-07-10 LAB — CERVICOVAGINAL ANCILLARY ONLY
Bacterial Vaginitis (gardnerella): NEGATIVE
Candida Glabrata: NEGATIVE
Candida Vaginitis: NEGATIVE
Comment: NEGATIVE
Comment: NEGATIVE
Comment: NEGATIVE

## 2020-07-10 LAB — URINE CULTURE: Culture: NO GROWTH

## 2020-09-04 ENCOUNTER — Ambulatory Visit: Payer: No Typology Code available for payment source | Admitting: Family Medicine

## 2020-10-08 ENCOUNTER — Other Ambulatory Visit: Payer: Self-pay

## 2020-10-08 ENCOUNTER — Encounter: Payer: Self-pay | Admitting: Emergency Medicine

## 2020-10-08 ENCOUNTER — Ambulatory Visit
Admission: EM | Admit: 2020-10-08 | Discharge: 2020-10-08 | Disposition: A | Discharge status: Home / Self Care | Payer: No Typology Code available for payment source | Attending: Physician Assistant | Admitting: Physician Assistant

## 2020-10-08 DIAGNOSIS — B9689 Other specified bacterial agents as the cause of diseases classified elsewhere: Secondary | ICD-10-CM | POA: Insufficient documentation

## 2020-10-08 DIAGNOSIS — N760 Acute vaginitis: Secondary | ICD-10-CM | POA: Diagnosis not present

## 2020-10-08 DIAGNOSIS — R3989 Other symptoms and signs involving the genitourinary system: Secondary | ICD-10-CM | POA: Insufficient documentation

## 2020-10-08 LAB — URINALYSIS, COMPLETE (UACMP) WITH MICROSCOPIC
Bilirubin Urine: NEGATIVE
Glucose, UA: NEGATIVE mg/dL
Hgb urine dipstick: NEGATIVE
Ketones, ur: NEGATIVE mg/dL
Leukocytes,Ua: NEGATIVE
Nitrite: NEGATIVE
Protein, ur: NEGATIVE mg/dL
Specific Gravity, Urine: 1.025 (ref 1.005–1.030)
pH: 6 (ref 5.0–8.0)

## 2020-10-08 LAB — WET PREP, GENITAL
Sperm: NONE SEEN
Trich, Wet Prep: NONE SEEN
Yeast Wet Prep HPF POC: NONE SEEN

## 2020-10-08 MED ORDER — METRONIDAZOLE 500 MG PO TABS: 500.0000 mg | ORAL_TABLET | Freq: Two times a day (BID) | ORAL | 0 refills | Status: AC

## 2020-10-08 MED ORDER — FLUCONAZOLE 150 MG PO TABS: 150.0000 mg | ORAL_TABLET | Freq: Once | ORAL | 0 refills | Status: DC

## 2020-10-08 MED ORDER — METRONIDAZOLE 500 MG PO TABS: 500.0000 mg | ORAL_TABLET | Freq: Two times a day (BID) | ORAL | 0 refills | Status: DC

## 2020-10-08 MED ORDER — FLUCONAZOLE 150 MG PO TABS: 150.0000 mg | ORAL_TABLET | Freq: Once | ORAL | 0 refills | Status: AC

## 2020-10-08 NOTE — Discharge Instructions (Signed)
You have bacterial vaginosis.  I have sent antibiotics for this.  If you develop a yeast infection you can take the Diflucan but you do not appear to have yeast infection at this time.  I have sent it since you have a history of yeast infections and will be on antibiotics for the BV infection.  You have a potential developing 1.  The urinalysis was not consistent with a UTI but I have sent the urine for culture and we will call you if you need to start a different antibiotic.  Increase fluids.  Follow-up with [place] as needed.

## 2020-10-08 NOTE — ED Provider Notes (Signed)
[facility]    CSN: [id number] Arrival date & time: 10/08/20  1225      History   Chief Complaint Chief Complaint  Patient presents with   Urinary Retention    HPI Shervon Segers is a 45 y.o. female presenting for approximately 3-day history of lower abdominal/bladder pressure and sensation as if she needs to urinate.  She also mitts to a bit of increased frequency and vaginal discharge which is thin.  Additionally she has some mild right lower back pain.  No fever, fatigue, chills, vomiting and no mention of hematuria.  Patient says she has history of yeast infections and believes she could have a UTI or a possible yeast infection.  Took AZO yesterday without relief of symptoms.  No other complaints.  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[place]) 02/19/2017   Plantar fasciitis 02/19/2017   Melasma 02/19/2017   Acne rosacea 02/19/2017   Essential hypertension 04/22/2014    Past Surgical History:  Procedure Laterality Date   ADRENALECTOMY Right 12/ 2013     at [facility]   adrenolecotmy  2013   [place]  x2 last one 2004   Bilateral Tubal Ligation w/ laset c/s   DILATION AND CURETTAGE OF UTERUS     ENDOMETRIAL ABLATION N/A 05/20/2017   Procedure: ENDOMETRIAL ABLATION;  Surgeon: [name], MD;  Location: [place];  Service: Gynecology;  Laterality: N/A;   EVALUATION UNDER ANESTHESIA WITH HEMORRHOIDECTOMY N/A 05/10/2020   Procedure: EXAM UNDER ANESTHESIA WITH HEMORRHOIDECTOMY;  Surgeon: Benjamine Sprague, DO;  Location: [facility];  Service: General;  Laterality: N/A;    HYSTEROSCOPY WITH D & C N/A 05/20/2017   Procedure: DILATATION AND CURETTAGE /HYSTEROSCOPY WITH MINERVA;  Surgeon: [name], MD;  Location: [place];  Service: Gynecology;  Laterality: N/A;   TRANSTHORACIC ECHOCARDIOGRAM  03-10-2011   [facility] (care everywhere)   mild LVH, ef >55%/  trivial PR and TR   TUBAL LIGATION      OB History     Gravida  3   Para      Term      Preterm      AB  1   Living  2      SAB  1   IAB      Ectopic      Multiple      Live Births               Home Medications    Prior to Admission medications   Medication Sig Start Date End Date Taking? Authorizing Provider  acetaminophen (TYLENOL) 500 MG tablet Take 1,000 mg by mouth every 6 (six) hours as needed for moderate pain or headache.   Yes [provider]  ferrous sulfate 325 (65 FE) MG tablet Take 325 mg by mouth daily.   Yes [provider]  fexofenadine (ALLEGRA) 180 MG tablet Take 180 mg by mouth daily as needed for allergies or rhinitis.   Yes [provider]  hydrochlorothiazide (HYDRODIURIL) 25 MG  tablet Take 25 mg by mouth daily.   Yes [provider]  lisinopril (PRINIVIL,ZESTRIL) 10 MG tablet Take 10 mg by mouth daily. 12/21/16  Yes [provider]  meclizine (ANTIVERT) 25 MG tablet Take 25 mg by mouth 3 (three) times daily as needed for dizziness.   Yes [provider]  meloxicam (MOBIC) 7.5 MG tablet Take 7.5 mg by mouth daily as needed for pain.   Yes [provider]  METAMUCIL FIBER PO Take 5-6 capsules by mouth daily as needed (constipation).   Yes [provider]  Multiple Vitamin (MULTIVITAMIN ADULT PO) Take by mouth.   Yes [provider]  fluconazole (DIFLUCAN) 150 MG tablet Take 1 tablet (150 mg total) by mouth once for 1 dose. 10/08/20 10/08/20  Danton Clap, PA-C  metFORMIN (GLUCOPHAGE-XR) 500 MG 24 hr tablet Take 500 mg by mouth daily. 07/05/20   [provider]  metroNIDAZOLE (FLAGYL) 500 MG tablet Take 1 tablet (500 mg total) by mouth 2 (two) times daily for 7 days. 10/08/20 10/15/20  Danton Clap, PA-C  traMADol (ULTRAM) 50 MG tablet Take 1 tablet (50 mg total) by mouth every 6 (six) hours as needed (pain not controlled by other meds). 05/10/20 05/10/21  Benjamine Sprague, DO    Family History Family History  Problem Relation Age of Onset   Breast cancer Maternal Aunt 52    Social History Social History   Tobacco Use   Smoking status: Never   Smokeless tobacco: Never  Vaping Use   Vaping Use: Never used  Substance Use Topics   Alcohol use: No   Drug use: No     Allergies   Oxycodone   Review of Systems Review of Systems  Constitutional:  Negative for fatigue and fever.  Gastrointestinal:  Positive for abdominal pain. Negative for nausea and vomiting.  Genitourinary:  Positive for frequency and vaginal discharge. Negative for difficulty urinating, dysuria, flank pain, urgency and vaginal pain.  Musculoskeletal:  Positive for back pain.    Physical Exam Triage Vital Signs ED Triage Vitals  Enc Vitals Group     BP 10/08/20 1332 (!) 129/98     Pulse Rate 10/08/20 1332 81     Resp 10/08/20 1332 18     Temp 10/08/20 1332 99.4 F (37.4 C)     Temp Source 10/08/20 1332 Oral     SpO2 10/08/20 1332 100 %     Weight 10/08/20 1330 169 lb 15.6 oz (77.1 kg)     Height 10/08/20 1330 '5\' 4"'$  (1.626 m)     Head Circumference --      Peak Flow --      Pain Score 10/08/20 1329 6     Pain Loc --      Pain Edu? --      Excl. in [place]? --    No data found.  Updated Vital Signs BP (!) 129/98 (BP Location: Right Arm)   Pulse 81   Temp 99.4 F (37.4 C) (Oral)   Resp 18   Ht '5\' 4"'$  (1.626 m)   Wt 169 lb 15.6 oz (77.1 kg)   SpO2 100%   BMI 29.18 kg/m      Physical Exam Vitals and nursing note reviewed.  Constitutional:      General: She is not in acute distress.    Appearance: Normal appearance. She is not ill-appearing or  toxic-appearing.  HENT:     Head: Normocephalic and atraumatic.  Eyes:  General: No scleral icterus.       Right eye: No discharge.        Left eye: No discharge.     Conjunctiva/sclera: Conjunctivae normal.  Cardiovascular:     Rate and Rhythm: Normal rate and regular rhythm.     Heart sounds: Normal heart sounds.  Pulmonary:     Effort: Pulmonary effort is normal. No respiratory distress.     Breath sounds: Normal breath sounds.  Abdominal:     Palpations: Abdomen is soft.     Tenderness: There is abdominal tenderness (mild suprapubic TTP). There is no right CVA tenderness or left CVA tenderness.  Musculoskeletal:     Cervical back: Neck supple.  Skin:    General: Skin is dry.  Neurological:     General: No focal deficit present.     Mental Status: She is alert. Mental status is at baseline.     Motor: No weakness.     Gait: Gait normal.  Psychiatric:        Mood and Affect: Mood normal.        Behavior: Behavior normal.        Thought Content: Thought content normal.     UC Treatments / Results  Labs (all labs ordered are listed, but only abnormal results are displayed) Labs Reviewed  WET PREP, GENITAL - Abnormal; Notable for the following components:      Result Value   Clue Cells Wet Prep HPF POC PRESENT (*)    WBC, Wet Prep HPF POC FEW (*)    All other components within normal limits  URINALYSIS, COMPLETE (UACMP) WITH MICROSCOPIC - Abnormal; Notable for the following components:   Bacteria, UA FEW (*)    All other components within normal limits  URINE CULTURE    EKG   Radiology No results found.  Procedures Procedures (including critical care time)  Medications Ordered in UC Medications - No data to display  Initial Impression / Assessment and Plan / UC Course  I have reviewed the triage vital signs and the nursing notes.  Pertinent labs & imaging results that were available during my care of the patient were reviewed by me and considered in my  medical decision making (see chart for details).  45 year old female presenting for approximately 3-day history of bladder pressure, frequency of urination and vaginal discharge.  UA today is clear.  We will send urine for culture and treat for UTI if urine culture grows bacteria.  Wet prep is positive for clue cells though.  Reviewed with patient that she has BV.  I have sent in metronidazole.  Patient also reports history of frequent yeast infection so I have sent in Diflucan in case she develops yeast infection with taking the antibiotics.  Reviewed supportive care as well.  Follow-up as needed.  Final Clinical Impressions(s) / UC Diagnoses   Final diagnoses:  Bacterial vaginosis  Sensation of pressure in bladder area     Discharge Instructions      You have bacterial vaginosis.  I have sent antibiotics for this.  If you develop a yeast infection you can take the Diflucan but you do not appear to have yeast infection at this time.  I have sent it since you have a history of yeast infections and will be on antibiotics for the BV infection.  You have a potential developing 1.  The urinalysis was not consistent with a UTI but I have sent the urine for culture and we will call you  if you need to start a different antibiotic.  Increase fluids.  Follow-up with [place] as needed.     ED Prescriptions     Medication Sig Dispense Auth. Provider   metroNIDAZOLE (FLAGYL) 500 MG tablet  (Status: Discontinued) Take 1 tablet (500 mg total) by mouth 2 (two) times daily for 7 days. 14 tablet Laurene Footman B, PA-C   fluconazole (DIFLUCAN) 150 MG tablet  (Status: Discontinued) Take 1 tablet (150 mg total) by mouth once for 1 dose. 1 tablet Laurene Footman B, PA-C   fluconazole (DIFLUCAN) 150 MG tablet Take 1 tablet (150 mg total) by mouth once for 1 dose. 1 tablet Laurene Footman B, PA-C   metroNIDAZOLE (FLAGYL) 500 MG tablet Take 1 tablet (500 mg total) by mouth 2 (two) times daily for 7 days. 14 tablet Gretta Cool      PDMP not reviewed this encounter.   Danton Clap, PA-C 10/08/20 1431

## 2020-10-08 NOTE — ED Triage Notes (Signed)
Pt c/o urinary retention and pressure in her bladder area, and right lower back pain. Started about 3 days ago.

## 2020-10-10 LAB — URINE CULTURE: Culture: NO GROWTH

## 2020-10-29 ENCOUNTER — Other Ambulatory Visit: Payer: Self-pay | Admitting: Family Medicine

## 2020-10-29 DIAGNOSIS — Z1231 Encounter for screening mammogram for malignant neoplasm of breast: Secondary | ICD-10-CM

## 2020-11-15 ENCOUNTER — Ambulatory Visit: Payer: No Typology Code available for payment source

## 2020-11-27 ENCOUNTER — Ambulatory Visit: Payer: No Typology Code available for payment source

## 2020-11-29 ENCOUNTER — Ambulatory Visit
Admission: RE | Admit: 2020-11-29 | Discharge: 2020-11-29 | Disposition: A | Discharge status: Home / Self Care | Payer: No Typology Code available for payment source | Source: Ambulatory Visit | Attending: Family Medicine | Admitting: Family Medicine

## 2020-11-29 ENCOUNTER — Other Ambulatory Visit: Payer: Self-pay

## 2020-11-29 DIAGNOSIS — Z1231 Encounter for screening mammogram for malignant neoplasm of breast: Secondary | ICD-10-CM

## 2021-10-29 ENCOUNTER — Other Ambulatory Visit: Payer: Self-pay | Admitting: Family Medicine

## 2021-10-29 DIAGNOSIS — Z1231 Encounter for screening mammogram for malignant neoplasm of breast: Secondary | ICD-10-CM

## 2021-12-16 ENCOUNTER — Ambulatory Visit
Admission: RE | Admit: 2021-12-16 | Discharge: 2021-12-16 | Disposition: A | Discharge status: Home / Self Care | Payer: No Typology Code available for payment source | Source: Ambulatory Visit | Attending: Family Medicine | Admitting: Family Medicine

## 2021-12-16 DIAGNOSIS — Z1231 Encounter for screening mammogram for malignant neoplasm of breast: Secondary | ICD-10-CM | POA: Diagnosis present

## 2022-09-20 ENCOUNTER — Encounter: Payer: Self-pay | Admitting: Emergency Medicine

## 2022-09-20 ENCOUNTER — Ambulatory Visit
Admission: EM | Admit: 2022-09-20 | Discharge: 2022-09-20 | Disposition: A | Discharge status: Home / Self Care | Payer: No Typology Code available for payment source | Attending: Emergency Medicine | Admitting: Emergency Medicine

## 2022-09-20 DIAGNOSIS — N3000 Acute cystitis without hematuria: Secondary | ICD-10-CM

## 2022-09-20 DIAGNOSIS — R350 Frequency of micturition: Secondary | ICD-10-CM | POA: Diagnosis present

## 2022-09-20 LAB — URINALYSIS, W/ REFLEX TO CULTURE (INFECTION SUSPECTED)
Bilirubin Urine: NEGATIVE
Glucose, UA: NEGATIVE mg/dL
Hgb urine dipstick: NEGATIVE
Ketones, ur: NEGATIVE mg/dL
Nitrite: NEGATIVE
Protein, ur: NEGATIVE mg/dL
Specific Gravity, Urine: 1.01 (ref 1.005–1.030)
pH: 7 (ref 5.0–8.0)

## 2022-09-20 MED ORDER — NITROFURANTOIN MONOHYD MACRO 100 MG PO CAPS: 100.0000 mg | ORAL_CAPSULE | Freq: Two times a day (BID) | ORAL | 0 refills | Status: AC

## 2022-09-20 MED ORDER — FLUCONAZOLE 150 MG PO TABS: 150.0000 mg | ORAL_TABLET | ORAL | 0 refills | Status: AC | PRN

## 2022-09-20 NOTE — ED Provider Notes (Signed)
[facility] [place] [facility] - [place], [place]   Name: Judith Wheeler DOB: May 31, 1975 MRN: [id number] CSN: [id number] PCP: Rayetta Humphrey, MD  Arrival date and time:  09/20/22 1352  Chief Complaint:  Urinary Frequency   NOTE: Prior to seeing the patient today, I have reviewed the triage nursing documentation and vital signs. Clinical staff has updated patient's PMH/PSHx, current medication list, and drug allergies/intolerances to ensure comprehensive history available to assist in medical decision making.   History:   HPI: Judith Wheeler is a 47 y.o. female who presents today alone with complaints of painful urination and abdominal pain.  Patient states that symptoms started approximately 24 hours ago.  She endorses abdominal pressure and burning with urination.  She also has noticed decreased frequency of urination, though she feels she has incomplete emptying.  She denies any systemic symptoms such as nausea, vomiting, diarrhea or fever.  No recent antibiotics or previous illnesses.  Her last UTI was greater than 1 year ago.   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)   Seasonal allergies    Wears contact lenses     Past Surgical History:  Procedure Laterality Date   ADRENALECTOMY Right 12/ 2013     at [facility]   adrenolecotmy  2013   CESAREAN SECTION  x2 last one 2004   Bilateral Tubal Ligation w/ laset c/s   DILATION AND CURETTAGE OF UTERUS     ENDOMETRIAL ABLATION N/A 05/20/2017   Procedure: ENDOMETRIAL ABLATION;  Surgeon: Reva Bores, MD;  Location: [facility];  Service: Gynecology;  Laterality: N/A;   EVALUATION UNDER ANESTHESIA WITH HEMORRHOIDECTOMY N/A 05/10/2020   Procedure: EXAM UNDER ANESTHESIA WITH HEMORRHOIDECTOMY;  Surgeon: Sung Amabile, DO;  Location: [facility];  Service: General;   Laterality: N/A;   HYSTEROSCOPY WITH D & C N/A 05/20/2017   Procedure: DILATATION AND CURETTAGE /HYSTEROSCOPY WITH MINERVA;  Surgeon: Reva Bores, MD;  Location: [facility];  Service: Gynecology;  Laterality: N/A;   TRANSTHORACIC ECHOCARDIOGRAM  03-10-2011   [facility] (care everywhere)   mild LVH, ef >55%/  trivial PR and TR   TUBAL LIGATION      Family History  Problem Relation Age of Onset   Breast cancer Maternal Aunt 74    Social History   Tobacco Use   Smoking status: Never   Smokeless tobacco: Never  Vaping Use   Vaping status: Never Used  Substance Use Topics   Alcohol use: No   Drug use: No    Patient Active Problem List   Diagnosis Date Noted   Menorrhagia 03/19/2017   Seasonal allergies 02/19/2017   Primary aldosteronism (HCC) 02/19/2017   Plantar fasciitis 02/19/2017   Melasma 02/19/2017   Acne rosacea 02/19/2017   Essential hypertension 04/22/2014    Home Medications:    Current Meds  Medication Sig   fluconazole (DIFLUCAN) 150 MG tablet Take 1 tablet (150 mg total) by mouth as needed.   nitrofurantoin, macrocrystal-monohydrate, (MACROBID) 100 MG capsule Take 1 capsule (100 mg total) by mouth 2 (two) times daily.   Semaglutide,0.25 or 0.5MG /DOS, 2 MG/1.5ML SOPN Inject into the skin.    Allergies:   Oxycodone  Review of Systems (ROS): Review of Systems  Constitutional:  Negative for chills, fatigue and  fever.  Gastrointestinal:  Negative for abdominal pain, diarrhea, nausea and vomiting.  Genitourinary:  Positive for difficulty urinating, dysuria, frequency and pelvic pain. Negative for flank pain.  Skin:  Negative for color change.  All other systems reviewed and are negative.    Vital Signs: Today's Vitals   09/20/22 1414 09/20/22 1416  BP:  116/83  Pulse:  85  Resp:  14  Temp:  98.3 F (36.8 C)  TempSrc:  Oral  SpO2:  97%  Weight: 169 lb 15.6 oz (77.1 kg)   Height: 5\' 4"  (1.626 m)   PainSc: 2      Physical  Exam: Physical Exam Vitals and nursing note reviewed.  Constitutional:      Appearance: Normal appearance.  Cardiovascular:     Rate and Rhythm: Normal rate and regular rhythm.     Pulses: Normal pulses.     Heart sounds: Normal heart sounds.  Pulmonary:     Breath sounds: Normal breath sounds.  Abdominal:     Tenderness: There is abdominal tenderness in the right lower quadrant, suprapubic area and left lower quadrant. There is no right CVA tenderness or left CVA tenderness.  Skin:    General: Skin is warm and dry.  Neurological:     Mental Status: She is alert.  Psychiatric:        Mood and Affect: Mood normal.        Behavior: Behavior normal.        Thought Content: Thought content normal.      Urgent Care Treatments / Results:   LABS: PLEASE NOTE: all labs that were ordered this encounter are listed, however only abnormal results are displayed. Labs Reviewed  URINALYSIS, W/ REFLEX TO CULTURE (INFECTION SUSPECTED) - Abnormal; Notable for the following components:      Result Value   Color, Urine STRAW (*)    Leukocytes,Ua TRACE (*)    Bacteria, UA FEW (*)    All other components within normal limits  URINE CULTURE    EKG: -None  RADIOLOGY: No results found.  PROCEDURES: Procedures  MEDICATIONS RECEIVED THIS VISIT: Medications - No data to display  PERTINENT CLINICAL COURSE NOTES/UPDATES:   Initial Impression / Assessment and Plan / Urgent Care Course:  Pertinent labs & imaging results that were available during my care of the patient were personally reviewed by me and considered in my medical decision making (see lab/imaging section of note for values and interpretations).  Judith Wheeler is a 47 y.o. female who presents to [facility] today with complaints of dysuria, diagnosed with acute cystitis without hematuria, and treated as such with the medications below. NP and patient reviewed discharge instructions below during visit.   Patient is well  appearing overall in clinic today. She does not appear to be in any acute distress. Presenting symptoms (see HPI) and exam as documented above.   I have reviewed the follow up and strict return precautions for any new or worsening symptoms. Patient is aware of symptoms that would be deemed urgent/emergent, and would thus require further evaluation either here or in the emergency department. At the time of discharge, she verbalized understanding and consent with the discharge plan as it was reviewed with her. All questions were fielded by provider and/or clinic staff prior to patient discharge.    Final Clinical Impressions / Urgent Care Diagnoses:   Final diagnoses:  Acute cystitis without hematuria    New Prescriptions:  [place] Controlled Substance Registry consulted? Not Applicable  Meds  ordered this encounter  Medications   nitrofurantoin, macrocrystal-monohydrate, (MACROBID) 100 MG capsule    Sig: Take 1 capsule (100 mg total) by mouth 2 (two) times daily.    Dispense:  10 capsule    Refill:  0   fluconazole (DIFLUCAN) 150 MG tablet    Sig: Take 1 tablet (150 mg total) by mouth as needed.    Dispense:  2 tablet    Refill:  0      Discharge Instructions      You were seen for urinary tract symptoms and are being treated for urinary tract infection.   - We are sending your urine out for a culture. If we need to add or change any medications, our nurse will give you a call to let you know. - Take the antibiotics as prescribed until they're finished. If you think you're having a reaction, stop the medication, take benadryl and go to the nearest urgent care/emergency room. Take a probiotic while taking the antibiotic to decrease the chances of stomach upset.  -You chose to not get a prescription for Pyridium, therefore increase her water intake to help with your symptoms.  Take care, Dr. Sharlet Salina, NP-c     Recommended Follow up Care:  Patient encouraged to follow up with the  following provider within the specified time frame, or sooner as dictated by the severity of her symptoms. As always, she was instructed that for any urgent/emergent care needs, she should seek care either here or in the emergency department for more immediate evaluation.   Bailey Mech, DNP, NP-c   Bailey Mech, NP 09/20/22 [phone]

## 2022-09-20 NOTE — Discharge Instructions (Addendum)
You were seen for urinary tract symptoms and are being treated for urinary tract infection.   - We are sending your urine out for a culture. If we need to add or change any medications, our nurse will give you a call to let you know. - Take the antibiotics as prescribed until they're finished. If you think you're having a reaction, stop the medication, take benadryl and go to the nearest urgent care/emergency room. Take a probiotic while taking the antibiotic to decrease the chances of stomach upset.  -You chose to not get a prescription for Pyridium, therefore increase her water intake to help with your symptoms.  Take care, Dr. Sharlet Salina, NP-c

## 2022-09-20 NOTE — ED Triage Notes (Signed)
Patient c/o bladder pressure and urinary frequency that started yesterday.

## 2022-09-21 LAB — URINE CULTURE: Culture: <10000 — AB

## 2022-09-23 ENCOUNTER — Telehealth (HOSPITAL_COMMUNITY): Payer: Self-pay | Admitting: Emergency Medicine

## 2022-09-23 NOTE — Telephone Encounter (Signed)
Patient returned call and we were able to review recent urine culture.  All questions answered.  F/u discussed

## 2022-11-11 ENCOUNTER — Other Ambulatory Visit: Payer: Self-pay | Admitting: Family Medicine

## 2022-11-11 DIAGNOSIS — Z1231 Encounter for screening mammogram for malignant neoplasm of breast: Secondary | ICD-10-CM
